# Patient Record
Sex: Female | Born: 1945 | Race: White | Hispanic: No | Marital: Single | Smoking: Never smoker
Health system: Southern US, Community
[De-identification: ages and names within clinical notes are randomized; demographics above are authoritative.]

## PROBLEM LIST (undated history)

## (undated) DIAGNOSIS — G43909 Migraine, unspecified, not intractable, without status migrainosus: Secondary | ICD-10-CM

## (undated) DIAGNOSIS — M810 Age-related osteoporosis without current pathological fracture: Secondary | ICD-10-CM

## (undated) DIAGNOSIS — R87619 Unspecified abnormal cytological findings in specimens from cervix uteri: Secondary | ICD-10-CM

## (undated) DIAGNOSIS — D219 Benign neoplasm of connective and other soft tissue, unspecified: Secondary | ICD-10-CM

## (undated) DIAGNOSIS — I10 Essential (primary) hypertension: Secondary | ICD-10-CM

## (undated) HISTORY — DX: Age-related osteoporosis without current pathological fracture: M81.0

## (undated) HISTORY — PX: BREAST BIOPSY: SHX20

## (undated) HISTORY — DX: Migraine, unspecified, not intractable, without status migrainosus: G43.909

## (undated) HISTORY — PX: HEMORROIDECTOMY: SUR656

## (undated) HISTORY — PX: BREAST EXCISIONAL BIOPSY: SUR124

## (undated) HISTORY — DX: Benign neoplasm of connective and other soft tissue, unspecified: D21.9

## (undated) HISTORY — DX: Essential (primary) hypertension: I10

## (undated) HISTORY — PX: OTHER SURGICAL HISTORY: SHX169

## (undated) HISTORY — PX: HYSTEROSCOPY: SHX211

## (undated) HISTORY — DX: Unspecified abnormal cytological findings in specimens from cervix uteri: R87.619

---

## 1971-09-28 HISTORY — PX: TUBAL LIGATION: SHX77

## 1986-09-27 HISTORY — PX: OTHER SURGICAL HISTORY: SHX169

## 1998-11-05 ENCOUNTER — Ambulatory Visit: Admission: RE | Admit: 1998-11-05 | Discharge: 1998-11-05 | Payer: Self-pay | Admitting: Internal Medicine

## 1998-11-17 ENCOUNTER — Ambulatory Visit: Admission: RE | Admit: 1998-11-17 | Discharge: 1998-11-17 | Payer: Self-pay | Admitting: Internal Medicine

## 1999-07-07 ENCOUNTER — Encounter: Admission: RE | Admit: 1999-07-07 | Discharge: 1999-07-07 | Payer: Self-pay | Admitting: Internal Medicine

## 1999-07-23 ENCOUNTER — Other Ambulatory Visit: Admission: RE | Admit: 1999-07-23 | Discharge: 1999-07-23 | Payer: Self-pay | Admitting: Obstetrics and Gynecology

## 2000-08-01 ENCOUNTER — Other Ambulatory Visit: Admission: RE | Admit: 2000-08-01 | Discharge: 2000-08-01 | Payer: Self-pay | Admitting: Obstetrics and Gynecology

## 2000-08-01 ENCOUNTER — Encounter: Admission: RE | Admit: 2000-08-01 | Discharge: 2000-08-01 | Payer: Self-pay | Admitting: Obstetrics and Gynecology

## 2000-08-01 ENCOUNTER — Encounter: Payer: Self-pay | Admitting: Obstetrics and Gynecology

## 2000-08-04 ENCOUNTER — Encounter: Payer: Self-pay | Admitting: Obstetrics and Gynecology

## 2000-08-04 ENCOUNTER — Encounter: Admission: RE | Admit: 2000-08-04 | Discharge: 2000-08-04 | Payer: Self-pay | Admitting: Obstetrics and Gynecology

## 2001-09-01 ENCOUNTER — Other Ambulatory Visit: Admission: RE | Admit: 2001-09-01 | Discharge: 2001-09-01 | Payer: Self-pay | Admitting: Obstetrics and Gynecology

## 2001-09-04 ENCOUNTER — Encounter: Payer: Self-pay | Admitting: Obstetrics and Gynecology

## 2001-09-04 ENCOUNTER — Ambulatory Visit (HOSPITAL_COMMUNITY): Admission: RE | Admit: 2001-09-04 | Discharge: 2001-09-04 | Payer: Self-pay | Admitting: Obstetrics and Gynecology

## 2001-09-21 ENCOUNTER — Encounter: Payer: Self-pay | Admitting: Obstetrics and Gynecology

## 2001-09-21 ENCOUNTER — Encounter: Admission: RE | Admit: 2001-09-21 | Discharge: 2001-09-21 | Payer: Self-pay | Admitting: Obstetrics and Gynecology

## 2002-08-17 ENCOUNTER — Encounter: Admission: RE | Admit: 2002-08-17 | Discharge: 2002-08-17 | Payer: Self-pay | Admitting: Obstetrics and Gynecology

## 2002-08-17 ENCOUNTER — Encounter: Payer: Self-pay | Admitting: Obstetrics and Gynecology

## 2002-10-12 ENCOUNTER — Other Ambulatory Visit: Admission: RE | Admit: 2002-10-12 | Discharge: 2002-10-12 | Payer: Self-pay | Admitting: Obstetrics and Gynecology

## 2003-08-20 ENCOUNTER — Ambulatory Visit (HOSPITAL_COMMUNITY): Admission: RE | Admit: 2003-08-20 | Discharge: 2003-08-20 | Payer: Self-pay | Admitting: Family Medicine

## 2004-09-07 ENCOUNTER — Ambulatory Visit (HOSPITAL_COMMUNITY): Admission: RE | Admit: 2004-09-07 | Discharge: 2004-09-07 | Payer: Self-pay | Admitting: Obstetrics and Gynecology

## 2004-12-11 ENCOUNTER — Other Ambulatory Visit: Admission: RE | Admit: 2004-12-11 | Discharge: 2004-12-11 | Payer: Self-pay | Admitting: Obstetrics and Gynecology

## 2005-11-05 ENCOUNTER — Ambulatory Visit (HOSPITAL_COMMUNITY): Admission: RE | Admit: 2005-11-05 | Discharge: 2005-11-05 | Payer: Self-pay | Admitting: Obstetrics and Gynecology

## 2006-03-01 ENCOUNTER — Other Ambulatory Visit: Admission: RE | Admit: 2006-03-01 | Discharge: 2006-03-01 | Payer: Self-pay | Admitting: Obstetrics and Gynecology

## 2006-11-17 ENCOUNTER — Encounter: Admission: RE | Admit: 2006-11-17 | Discharge: 2006-11-17 | Payer: Self-pay | Admitting: Obstetrics and Gynecology

## 2007-04-21 ENCOUNTER — Other Ambulatory Visit: Admission: RE | Admit: 2007-04-21 | Discharge: 2007-04-21 | Payer: Self-pay | Admitting: Obstetrics and Gynecology

## 2007-11-24 ENCOUNTER — Encounter: Admission: RE | Admit: 2007-11-24 | Discharge: 2007-11-24 | Payer: Self-pay | Admitting: Obstetrics and Gynecology

## 2008-05-13 ENCOUNTER — Other Ambulatory Visit: Admission: RE | Admit: 2008-05-13 | Discharge: 2008-05-13 | Payer: Self-pay | Admitting: Obstetrics and Gynecology

## 2008-11-29 ENCOUNTER — Encounter: Admission: RE | Admit: 2008-11-29 | Discharge: 2008-11-29 | Payer: Self-pay | Admitting: Obstetrics and Gynecology

## 2009-09-15 ENCOUNTER — Ambulatory Visit (HOSPITAL_COMMUNITY): Admission: RE | Admit: 2009-09-15 | Discharge: 2009-09-15 | Payer: Self-pay | Admitting: Obstetrics and Gynecology

## 2009-12-05 ENCOUNTER — Encounter: Admission: RE | Admit: 2009-12-05 | Discharge: 2009-12-05 | Payer: Self-pay | Admitting: Obstetrics and Gynecology

## 2010-10-17 ENCOUNTER — Other Ambulatory Visit: Payer: Self-pay | Admitting: Obstetrics & Gynecology

## 2010-10-17 DIAGNOSIS — Z1239 Encounter for other screening for malignant neoplasm of breast: Secondary | ICD-10-CM

## 2010-12-07 ENCOUNTER — Ambulatory Visit
Admission: RE | Admit: 2010-12-07 | Discharge: 2010-12-07 | Disposition: A | Discharge status: Home / Self Care | Payer: Medicare Other | Source: Ambulatory Visit | Attending: Obstetrics & Gynecology | Admitting: Obstetrics & Gynecology

## 2010-12-07 DIAGNOSIS — Z1239 Encounter for other screening for malignant neoplasm of breast: Secondary | ICD-10-CM

## 2010-12-28 LAB — BASIC METABOLIC PANEL
BUN: 16 mg/dL (ref 6–23)
CO2: 32 mEq/L (ref 19–32)
Calcium: 9.8 mg/dL (ref 8.4–10.5)
Chloride: 101 mEq/L (ref 96–112)
Creatinine, Ser: 0.41 mg/dL (ref 0.4–1.2)
GFR calc Af Amer: >60 mL/min (ref >60)
GFR calc non Af Amer: >60 mL/min (ref >60)
Glucose, Bld: 97 mg/dL (ref 70–99)
Potassium: 3.6 mEq/L (ref 3.5–5.1)
Sodium: 138 mEq/L (ref 135–145)

## 2010-12-28 LAB — CBC
HCT: 44.8 % (ref 36.0–46.0)
Hemoglobin: 15.1 g/dL — ABNORMAL HIGH (ref 12.0–15.0)
MCHC: 33.6 g/dL (ref 30.0–36.0)
MCV: 94.5 fL (ref 78.0–100.0)
Platelets: 241 10*3/uL (ref 150–400)
RBC: 4.74 MIL/uL (ref 3.87–5.11)
RDW: 13 % (ref 11.5–15.5)
WBC: 7.7 10*3/uL (ref 4.0–10.5)

## 2011-07-05 ENCOUNTER — Other Ambulatory Visit: Payer: Self-pay | Admitting: Obstetrics & Gynecology

## 2011-07-05 DIAGNOSIS — Z1231 Encounter for screening mammogram for malignant neoplasm of breast: Secondary | ICD-10-CM

## 2011-12-09 ENCOUNTER — Ambulatory Visit
Admission: RE | Admit: 2011-12-09 | Discharge: 2011-12-09 | Disposition: A | Discharge status: Home / Self Care | Payer: Medicare Other | Source: Ambulatory Visit | Attending: Obstetrics & Gynecology | Admitting: Obstetrics & Gynecology

## 2011-12-09 DIAGNOSIS — Z1231 Encounter for screening mammogram for malignant neoplasm of breast: Secondary | ICD-10-CM

## 2012-11-13 ENCOUNTER — Other Ambulatory Visit: Payer: Self-pay | Admitting: Obstetrics & Gynecology

## 2012-11-13 DIAGNOSIS — Z1231 Encounter for screening mammogram for malignant neoplasm of breast: Secondary | ICD-10-CM

## 2012-12-11 ENCOUNTER — Ambulatory Visit
Admission: RE | Admit: 2012-12-11 | Discharge: 2012-12-11 | Disposition: A | Discharge status: Home / Self Care | Payer: Medicare Other | Source: Ambulatory Visit | Attending: Obstetrics & Gynecology | Admitting: Obstetrics & Gynecology

## 2012-12-11 DIAGNOSIS — Z1231 Encounter for screening mammogram for malignant neoplasm of breast: Secondary | ICD-10-CM

## 2013-06-21 ENCOUNTER — Encounter: Payer: Self-pay | Admitting: Obstetrics & Gynecology

## 2013-10-01 ENCOUNTER — Other Ambulatory Visit: Payer: Self-pay | Admitting: Obstetrics and Gynecology

## 2013-10-01 ENCOUNTER — Other Ambulatory Visit: Payer: Self-pay

## 2013-10-01 ENCOUNTER — Telehealth: Payer: Self-pay | Admitting: Obstetrics & Gynecology

## 2013-10-01 DIAGNOSIS — M810 Age-related osteoporosis without current pathological fracture: Secondary | ICD-10-CM

## 2013-10-01 DIAGNOSIS — Z1231 Encounter for screening mammogram for malignant neoplasm of breast: Secondary | ICD-10-CM

## 2013-10-01 NOTE — Telephone Encounter (Signed)
Order placed for bone density by Dr. Quincy Simmonds for Dr. Sabra Heck.

## 2013-10-01 NOTE — Telephone Encounter (Signed)
Pt needs an order for her bone density scan faxed to the breast center. Appointment is scheduled for 3/19

## 2013-10-01 NOTE — Telephone Encounter (Signed)
OK for BMD in March? Last one 12-09-11.

## 2013-10-09 NOTE — Telephone Encounter (Signed)
Spoke with [facility]. Dexa added to appointment at beginning for 1030. Patient aware.

## 2013-10-10 ENCOUNTER — Ambulatory Visit: Payer: Self-pay | Admitting: Obstetrics & Gynecology

## 2013-10-11 ENCOUNTER — Encounter: Payer: Self-pay | Admitting: Obstetrics & Gynecology

## 2013-10-12 ENCOUNTER — Ambulatory Visit (INDEPENDENT_AMBULATORY_CARE_PROVIDER_SITE_OTHER): Payer: Medicare Other | Admitting: Obstetrics & Gynecology

## 2013-10-12 ENCOUNTER — Encounter: Payer: Self-pay | Admitting: Obstetrics & Gynecology

## 2013-10-12 VITALS — BP 130/64 | HR 72 | Resp 16 | Ht 63.75 in | Wt 160.2 lb

## 2013-10-12 DIAGNOSIS — Z01419 Encounter for gynecological examination (general) (routine) without abnormal findings: Secondary | ICD-10-CM

## 2013-10-12 NOTE — Progress Notes (Signed)
68 y.o. G2P2 SingleCaucasianF here for annual exam.  Doing well.  Had a good year and good holiday.  No vaginal bleeding.  Patient's last menstrual period was 08/27/2009.          Sexually active: no  The current method of family planning is BTL    Exercising: yes  walking Smoker:  no  Health Maintenance: Pap:  07/18/12 WNL History of abnormal Pap:  Yes years ago MMG:  12/11/12 normal Colonoscopy:  4/06 repeat in 10 years BMD:   12/09/11 osteoporosis, scheduled for 12/13/13 TDaP:  2014 Screening Labs: PCP, Hb today: PCP, Urine today: PCP   reports that she has never smoked. She has never used smokeless tobacco. She reports that she drinks alcohol. She reports that she does not use illicit drugs.  Past Medical History  Diagnosis Date  . Migraine   . Fibroids   . Hypertension   . Osteoporosis   . Abnormal Pap smear of cervix     Past Surgical History  Procedure Laterality Date  . Hysteroscopy      resection, myoma, large polyps  . Tubal ligation    . Hemorroidectomy    . Breast biopsy      left-simple ductal hyperplasia  . Lung wedge resection      right  . Ckc  1988    CIN I, ? CIS, BX-CIN 3    Current Outpatient Prescriptions  Medication Sig Dispense Refill  . aspirin 81 MG tablet Take 81 mg by mouth daily.      . cholecalciferol (VITAMIN D) 1000 UNITS tablet Take 1,000 Units by mouth daily.      . hydrochlorothiazide (MICROZIDE) 12.5 MG capsule Take 12.5 mg by mouth daily.      . Multiple Vitamins-Minerals (CENTRUM SILVER ADULT 50+ PO) Take by mouth daily.       No current facility-administered medications for this visit.    Family History  Problem Relation Age of Onset  . Breast cancer Mother 51  . Breast cancer Maternal Aunt   . Hypertension Mother   . Heart attack Father   . Colon cancer Sister     deceased    ROS:  Pertinent items are noted in HPI.  Otherwise, a comprehensive ROS was negative.  Exam:   BP 130/64  Pulse 72  Resp 16  Ht 5' 3.75"  (1.619 m)  Wt 160 lb 3.2 oz (72.666 kg)  BMI 27.72 kg/m2  LMP 08/27/2009  Weight change: no change   Height: 5' 3.75" (161.9 cm)  Ht Readings from Last 3 Encounters:  10/12/13 5' 3.75" (1.619 m)    General appearance: alert, cooperative and appears stated age Head: Normocephalic, without obvious abnormality, atraumatic Neck: no adenopathy, supple, symmetrical, trachea midline and thyroid normal to inspection and palpation Lungs: clear to auscultation bilaterally Breasts: normal appearance, no masses or tenderness Heart: regular rate and rhythm Abdomen: soft, non-tender; bowel sounds normal; no masses,  no organomegaly Extremities: extremities normal, atraumatic, no cyanosis or edema Skin: Skin color, texture, turgor normal. No rashes or lesions Lymph nodes: Cervical, supraclavicular, and axillary nodes normal. No abnormal inguinal nodes palpated Neurologic: Grossly normal   Pelvic: External genitalia:  no lesions              Urethra:  normal appearing urethra with no masses, tenderness or lesions              Bartholins and Skenes: normal  Vagina: normal appearing vagina with normal color and discharge, no lesions, 3rd degree cystocele              Cervix: no lesions              Pap taken: no Bimanual Exam:  Uterus:  normal size, contour, position, consistency, mobility, non-tender              Adnexa: normal adnexa and no mass, fullness, tenderness               Rectovaginal: Confirms               Anus:  normal sphincter tone, no lesions  A:  Well Woman with normal exam PMP, no HRT H/o fibroid uterus H/O CIN 3 s/p LEEP 1998, doing pap smears every other year Osteoporosis 3rd degree cystocele  P:   Mammogram yearly. Saw Dr. Allyson Sabal end of last year for skin survey pap smear every other year. Labs with Dr. Osborne Casco. Will add Vertebral fracture assessment to BMD scheduled already. D/W pt cystocele and treatment options.  She does not feel symptoms are  significant enough at this time for this.  Will continue to monitor. return annually or prn  An After Visit Summary was printed and given to the patient.

## 2013-10-12 NOTE — Patient Instructions (Signed)

## 2013-12-13 ENCOUNTER — Ambulatory Visit
Admission: RE | Admit: 2013-12-13 | Discharge: 2013-12-13 | Disposition: A | Discharge status: Home / Self Care | Payer: Medicare Other | Source: Ambulatory Visit

## 2013-12-13 ENCOUNTER — Ambulatory Visit
Admission: RE | Admit: 2013-12-13 | Discharge: 2013-12-13 | Disposition: A | Discharge status: Home / Self Care | Payer: Medicare Other | Source: Ambulatory Visit | Attending: Obstetrics and Gynecology | Admitting: Obstetrics and Gynecology

## 2013-12-13 ENCOUNTER — Ambulatory Visit: Payer: Medicare Other

## 2013-12-13 DIAGNOSIS — M810 Age-related osteoporosis without current pathological fracture: Secondary | ICD-10-CM

## 2013-12-13 DIAGNOSIS — Z1231 Encounter for screening mammogram for malignant neoplasm of breast: Secondary | ICD-10-CM

## 2013-12-25 ENCOUNTER — Encounter: Payer: Self-pay | Admitting: Obstetrics & Gynecology

## 2013-12-26 ENCOUNTER — Institutional Professional Consult (permissible substitution): Payer: Medicare Other | Admitting: Obstetrics & Gynecology

## 2013-12-28 ENCOUNTER — Encounter: Payer: Self-pay | Admitting: Obstetrics & Gynecology

## 2014-01-07 ENCOUNTER — Institutional Professional Consult (permissible substitution): Payer: Medicare Other | Admitting: Obstetrics & Gynecology

## 2014-01-14 ENCOUNTER — Institutional Professional Consult (permissible substitution): Payer: Medicare Other | Admitting: Obstetrics & Gynecology

## 2014-07-29 ENCOUNTER — Encounter: Payer: Self-pay | Admitting: Obstetrics & Gynecology

## 2014-09-26 ENCOUNTER — Other Ambulatory Visit: Payer: Self-pay

## 2014-09-26 DIAGNOSIS — Z1231 Encounter for screening mammogram for malignant neoplasm of breast: Secondary | ICD-10-CM

## 2014-10-22 ENCOUNTER — Encounter: Payer: Self-pay | Admitting: Obstetrics & Gynecology

## 2014-10-22 ENCOUNTER — Ambulatory Visit (INDEPENDENT_AMBULATORY_CARE_PROVIDER_SITE_OTHER): Payer: Medicare Other | Admitting: Obstetrics & Gynecology

## 2014-10-22 VITALS — BP 134/80 | HR 80 | Resp 16 | Ht 63.75 in | Wt 159.0 lb

## 2014-10-22 DIAGNOSIS — Z124 Encounter for screening for malignant neoplasm of cervix: Secondary | ICD-10-CM

## 2014-10-22 DIAGNOSIS — Z01419 Encounter for gynecological examination (general) (routine) without abnormal findings: Secondary | ICD-10-CM

## 2014-10-22 NOTE — Progress Notes (Signed)
69 y.o. G2P2 SingleCaucasianF here for annual exam.  Doing well.  No vaginal bleeding.  Pt declines having another colonoscopy.    Pt with known cystocele.  Pt reports she is not feeling any new symptoms.  Not interested in any surgical repair at this time.  occasional bladder leaking but this is mild and unchanged.    Patient's last menstrual period was 08/27/2009.          Sexually active: No.  The current method of family planning is post menopausal status.    Exercising: Yes.    Walking Smoker:  no  Health Maintenance: Pap: 06/2012 Neg History of abnormal Pap:  yes MMG:  11/2013 BIRADS1:neg Colonoscopy:  12/2004 every 10 years.  Declines!!  Does stool tests for blood with Dr. Osborne Casco. BMD:  11/2013, -2.7, -1.2 TDaP:  2014 Screening Labs: PCP, Hb today: PCP, Urine today: PCP   reports that she has never smoked. She has never used smokeless tobacco. She reports that she drinks about 4.2 oz of alcohol per week. She reports that she does not use illicit drugs.  Past Medical History  Diagnosis Date  . Migraine   . Fibroids   . Hypertension   . Osteoporosis   . Abnormal Pap smear of cervix     Past Surgical History  Procedure Laterality Date  . Hysteroscopy      resection, myoma, large polyps  . Tubal ligation    . Hemorroidectomy    . Breast biopsy      left-simple ductal hyperplasia  . Lung wedge resection      right  . Ckc  1988    CIN I, ? CIS, BX-CIN 3    Current Outpatient Prescriptions  Medication Sig Dispense Refill  . aspirin 81 MG tablet Take 81 mg by mouth daily.    . cholecalciferol (VITAMIN D) 1000 UNITS tablet Take 1,000 Units by mouth daily.    . hydrochlorothiazide (MICROZIDE) 12.5 MG capsule Take 12.5 mg by mouth daily.    . Multiple Vitamins-Minerals (CENTRUM SILVER ADULT 50+ PO) Take by mouth daily.     No current facility-administered medications for this visit.    Family History  Problem Relation Age of Onset  . Breast cancer Mother 21  .  Breast cancer Maternal Aunt   . Hypertension Mother   . Heart attack Father   . Colon cancer Sister     deceased    ROS:  Pertinent items are noted in HPI.  Otherwise, a comprehensive ROS was negative.  Exam:   General appearance: alert, cooperative and appears stated age Head: Normocephalic, without obvious abnormality, atraumatic Neck: no adenopathy, supple, symmetrical, trachea midline and thyroid normal to inspection and palpation Lungs: clear to auscultation bilaterally Breasts: normal appearance, no masses or tenderness Heart: regular rate and rhythm Abdomen: soft, non-tender; bowel sounds normal; no masses,  no organomegaly Extremities: extremities normal, atraumatic, no cyanosis or edema Skin: Skin color, texture, turgor normal. No rashes or lesions Lymph nodes: Cervical, supraclavicular, and axillary nodes normal. No abnormal inguinal nodes palpated Neurologic: Grossly normal   Pelvic: External genitalia:  no lesions              Urethra:  normal appearing urethra with no masses, tenderness or lesions              Bartholins and Skenes: normal                 Vagina: normal appearing vagina with normal color and discharge,  no lesions              Cervix: no lesions              Pap taken: Yes.   Bimanual Exam:  Uterus:  normal size, contour, position, consistency, mobility, non-tender              Adnexa: normal adnexa and no mass, fullness, tenderness               Rectovaginal: Confirms               Anus:  normal sphincter tone, no lesions  Chaperone was present for exam.  A:  Well Woman with normal exam PMP, no HRT H/o fibroid uterus H/O CIN 3 s/p LEEP 1998, doing pap smears every other year Osteoporosis 3rd degree cystocele  P: Mammogram yearly. pap smear today Labs/vaccines with Dr. Osborne Casco Pt aware of cystocele and continues to desire conservative management. return annually or prn

## 2014-10-24 LAB — IPS PAP SMEAR ONLY

## 2014-12-19 ENCOUNTER — Ambulatory Visit
Admission: RE | Admit: 2014-12-19 | Discharge: 2014-12-19 | Disposition: A | Discharge status: Home / Self Care | Payer: Medicare Other | Source: Ambulatory Visit

## 2014-12-19 DIAGNOSIS — Z1231 Encounter for screening mammogram for malignant neoplasm of breast: Secondary | ICD-10-CM

## 2015-03-24 ENCOUNTER — Encounter: Payer: Self-pay | Admitting: Obstetrics & Gynecology

## 2015-03-24 ENCOUNTER — Other Ambulatory Visit: Payer: Self-pay

## 2015-03-24 NOTE — Telephone Encounter (Signed)
Immunization updated.  Routing to provider for final review.   Will close encounter.

## 2015-11-11 ENCOUNTER — Other Ambulatory Visit: Payer: Self-pay

## 2015-11-11 DIAGNOSIS — Z1231 Encounter for screening mammogram for malignant neoplasm of breast: Secondary | ICD-10-CM

## 2015-12-22 ENCOUNTER — Ambulatory Visit
Admission: RE | Admit: 2015-12-22 | Discharge: 2015-12-22 | Disposition: A | Discharge status: Home / Self Care | Payer: Medicare Other | Source: Ambulatory Visit

## 2015-12-22 DIAGNOSIS — Z1231 Encounter for screening mammogram for malignant neoplasm of breast: Secondary | ICD-10-CM

## 2015-12-23 ENCOUNTER — Encounter: Payer: Self-pay | Admitting: Obstetrics & Gynecology

## 2015-12-23 ENCOUNTER — Ambulatory Visit (INDEPENDENT_AMBULATORY_CARE_PROVIDER_SITE_OTHER): Payer: Medicare Other | Admitting: Obstetrics & Gynecology

## 2015-12-23 VITALS — BP 136/78 | HR 86 | Resp 16 | Ht 63.5 in | Wt 162.0 lb

## 2015-12-23 DIAGNOSIS — I10 Essential (primary) hypertension: Secondary | ICD-10-CM

## 2015-12-23 DIAGNOSIS — D069 Carcinoma in situ of cervix, unspecified: Secondary | ICD-10-CM

## 2015-12-23 DIAGNOSIS — Z01419 Encounter for gynecological examination (general) (routine) without abnormal findings: Secondary | ICD-10-CM

## 2015-12-23 DIAGNOSIS — Z124 Encounter for screening for malignant neoplasm of cervix: Secondary | ICD-10-CM | POA: Diagnosis not present

## 2015-12-23 NOTE — Progress Notes (Addendum)
70 y.o. G2P2 SingleCaucasianF here for annual exam.  Doing well.  No vaginal bleeding.    PCP:  Dr. Osborne Casco.  Saw in October.  Brought lab work with her.  LMP:  ~1998          Sexually active: No.  The current method of family planning is post menopausal status.    Exercising: Yes.    Walking Smoker:  no  Health Maintenance: Pap:  10/22/14 Neg History of abnormal Pap:  yes MMG:  12/22/15 BIRADS1:neg Colonoscopy:  12/2004 Normal. Pt declines f/u.  Did test for blood in stool with Dr. Osborne Casco. BMD:   12/14/13 Osteoporosis  TDaP:  2014  Screening Labs: PCP, Urine today: PCP   reports that she has never smoked. She has never used smokeless tobacco. She reports that she drinks about 4.2 oz of alcohol per week. She reports that she does not use illicit drugs.  Past Medical History  Diagnosis Date  . Migraine   . Fibroids   . Hypertension   . Osteoporosis   . Abnormal Pap smear of cervix     Past Surgical History  Procedure Laterality Date  . Hysteroscopy      resection, myoma, large polyps  . Tubal ligation    . Hemorroidectomy    . Breast biopsy      left-simple ductal hyperplasia  . Lung wedge resection      right  . Ckc  1988    CIN I, ? CIS, BX-CIN 3    Current Outpatient Prescriptions  Medication Sig Dispense Refill  . aspirin 81 MG tablet Take 81 mg by mouth daily.    . hydrochlorothiazide (HYDRODIURIL) 12.5 MG tablet TK 1 T PO QD  4  . Multiple Vitamins-Minerals (CENTRUM SILVER ADULT 50+ PO) Take by mouth daily.     No current facility-administered medications for this visit.    Family History  Problem Relation Age of Onset  . Breast cancer Mother 36  . Breast cancer Maternal Aunt   . Hypertension Mother   . Heart attack Father   . Colon cancer Sister     deceased    ROS:  Pertinent items are noted in HPI.  Otherwise, a comprehensive ROS was negative.  Exam:   BP 136/78 mmHg  Pulse 86  Resp 16  Ht 5' 3.5" (1.613 m)  Wt 162 lb (73.483 kg)  BMI 28.24  kg/m2  LMP 08/27/2009  Weight change: +3#   Height: 5' 3.5" (161.3 cm)  Ht Readings from Last 3 Encounters:  12/23/15 5' 3.5" (1.613 m)  10/22/14 5' 3.75" (1.619 m)  10/12/13 5' 3.75" (1.619 m)    General appearance: alert, cooperative and appears stated age Head: Normocephalic, without obvious abnormality, atraumatic Neck: no adenopathy, supple, symmetrical, trachea midline and thyroid normal to inspection and palpation Lungs: clear to auscultation bilaterally Breasts: normal appearance, no masses or tenderness Heart: regular rate and rhythm Abdomen: soft, non-tender; bowel sounds normal; no masses,  no organomegaly Extremities: extremities normal, atraumatic, no cyanosis or edema Skin: Skin color, texture, turgor normal. No rashes or lesions Lymph nodes: Cervical, supraclavicular, and axillary nodes normal. No abnormal inguinal nodes palpated Neurologic: Grossly normal   Pelvic: External genitalia:  no lesions              Urethra:  normal appearing urethra with no masses, tenderness or lesions              Bartholins and Skenes: normal  Vagina: normal appearing vagina with normal color and discharge, no lesions              Cervix: no lesions              Pap taken: No. Bimanual Exam:  Uterus:  normal size, contour, position, consistency, mobility, non-tender              Adnexa: normal adnexa and no mass, fullness, tenderness               Rectovaginal: Confirms               Anus:  normal sphincter tone, no lesions  Chaperone was present for exam.  A:  Well Woman with normal exam PMP, no HRT H/o fibroid uterus H/O CIN 3 s/p LEEP 1998 Osteoporosis 3rd degree cystocele.  Declines treatment.  P: Mammogram yearly pap smear today Labs/vaccines with Dr. Osborne Casco Declines colonoscopy.  Pt aware I disagree with this. Declines repeat BMD, for now. return annually or prn

## 2015-12-24 ENCOUNTER — Telehealth: Payer: Self-pay

## 2015-12-24 ENCOUNTER — Encounter: Payer: Self-pay | Admitting: Obstetrics & Gynecology

## 2015-12-24 LAB — IPS PAP SMEAR ONLY

## 2015-12-24 NOTE — Telephone Encounter (Signed)
Changed on note from yesterday and in chart.  Thanks.  Ok to close encounter.

## 2015-12-24 NOTE — Telephone Encounter (Signed)
Telephone encounter created to discuss mychart message with Dr.Miller. 

## 2015-12-24 NOTE — Addendum Note (Signed)
Addended by: Megan Salon on: 12/24/2015 10:02 AM   Modules accepted: Miquel Dunn

## 2015-12-24 NOTE — Telephone Encounter (Signed)
Non-Urgent Medical Question  Message [id number]   From  [place], MD   Sent  12/24/2015 8:13 AM     I think the date of my LMP should be around 1990 (at around age 72-50) instead of 2010....if someone could correct that on my records......[name] KitchenSorry I didn't catch that yesterday when I was there.....  Thank you,  [place]      Responsible Party    Pool - [facility] Pool No one has taken responsibility for this message.     No actions have been taken on this message.     Routing to [place] for review.

## 2016-03-08 ENCOUNTER — Emergency Department (HOSPITAL_COMMUNITY)
Admission: EM | Admit: 2016-03-08 | Discharge: 2016-03-08 | Disposition: A | Discharge status: Home / Self Care | Payer: Medicare Other | Attending: Emergency Medicine | Admitting: Emergency Medicine

## 2016-03-08 ENCOUNTER — Encounter (HOSPITAL_COMMUNITY): Payer: Self-pay | Admitting: *Deleted

## 2016-03-08 ENCOUNTER — Emergency Department (HOSPITAL_COMMUNITY): Payer: Medicare Other

## 2016-03-08 DIAGNOSIS — Z791 Long term (current) use of non-steroidal anti-inflammatories (NSAID): Secondary | ICD-10-CM | POA: Diagnosis not present

## 2016-03-08 DIAGNOSIS — I10 Essential (primary) hypertension: Secondary | ICD-10-CM | POA: Diagnosis not present

## 2016-03-08 DIAGNOSIS — M810 Age-related osteoporosis without current pathological fracture: Secondary | ICD-10-CM | POA: Diagnosis not present

## 2016-03-08 DIAGNOSIS — H6121 Impacted cerumen, right ear: Secondary | ICD-10-CM

## 2016-03-08 DIAGNOSIS — R51 Headache: Secondary | ICD-10-CM | POA: Insufficient documentation

## 2016-03-08 DIAGNOSIS — Z79899 Other long term (current) drug therapy: Secondary | ICD-10-CM | POA: Diagnosis not present

## 2016-03-08 DIAGNOSIS — Z7982 Long term (current) use of aspirin: Secondary | ICD-10-CM | POA: Diagnosis not present

## 2016-03-08 DIAGNOSIS — R519 Headache, unspecified: Secondary | ICD-10-CM

## 2016-03-08 MED ORDER — CARBAMIDE PEROXIDE 6.5 % OT SOLN: 5.0000 [drp] | Freq: Two times a day (BID) | OTIC | Status: DC

## 2016-03-08 NOTE — Discharge Instructions (Signed)
Cerumen Impaction The structures of the external ear canal secrete a waxy substance known as cerumen. Excess cerumen can build up in the ear canal, causing a condition known as cerumen impaction. Cerumen impaction can cause ear pain and disrupt the function of the ear. The rate of cerumen production differs for each individual. In certain individuals, the configuration of the ear canal may decrease his or her ability to naturally remove cerumen. CAUSES Cerumen impaction is caused by excessive cerumen production or buildup. RISK FACTORS  Frequent use of swabs to clean ears.  Having narrow ear canals.  Having eczema.  Being dehydrated. SIGNS AND SYMPTOMS  Diminished hearing.  Ear drainage.  Ear pain.  Ear itch. TREATMENT Treatment may involve:  Over-the-counter or prescription ear drops to soften the cerumen.  Removal of cerumen by a health care provider. This may be done with:  Irrigation with warm water. This is the most common method of removal.  Ear curettes and other instruments.  Surgery. This may be done in severe cases. HOME CARE INSTRUCTIONS  Take medicines only as directed by your health care provider.  Do not insert objects into the ear with the intent of cleaning the ear. PREVENTION  Do not insert objects into the ear, even with the intent of cleaning the ear. Removing cerumen as a part of normal hygiene is not necessary, and the use of swabs in the ear canal is not recommended.  Drink enough water to keep your urine clear or pale yellow.  Control your eczema if you have it. SEEK MEDICAL CARE IF:  You develop ear pain.  You develop bleeding from the ear.  The cerumen does not clear after you use ear drops as directed.   This information is not intended to replace advice given to you by your health care provider. Make sure you discuss any questions you have with your health care provider.   Document Released: 10/21/2004 Document Revised: 10/04/2014  Document Reviewed: 04/30/2015 Elsevier Interactive Patient Education 2016 [place] Headache Without Cause A headache is pain or discomfort felt around the head or neck area. The specific cause of a headache may not be found. There are many causes and types of headaches. A few common ones are:  Tension headaches.  Migraine headaches.  Cluster headaches.  Chronic daily headaches. HOME CARE INSTRUCTIONS  Watch your condition for any changes. Take these steps to help with your condition: Managing Pain  Take over-the-counter and prescription medicines only as told by your health care provider.  Lie down in a dark, quiet room when you have a headache.  If directed, apply ice to the head and neck area:  Put ice in a plastic bag.  Place a towel between your skin and the bag.  Leave the ice on for 20 minutes, 2-3 times per day.  Use a heating pad or hot shower to apply heat to the head and neck area as told by your health care provider.  Keep lights dim if bright lights bother you or make your headaches worse. Eating and Drinking  Eat meals on a regular schedule.  Limit alcohol use.  Decrease the amount of caffeine you drink, or stop drinking caffeine. General Instructions  Keep all follow-up visits as told by your health care provider. This is important.  Keep a headache journal to help find out what may trigger your headaches. For example, write down:  What you eat and drink.  How much sleep you get.  Any change to  your diet or medicines.  Try massage or other relaxation techniques.  Limit stress.  Sit up straight, and do not tense your muscles.  Do not use tobacco products, including cigarettes, chewing tobacco, or e-cigarettes. If you need help quitting, ask your health care provider.  Exercise regularly as told by your health care provider.  Sleep on a regular schedule. Get 7-9 hours of sleep, or the amount recommended by your health care  provider. SEEK MEDICAL CARE IF:   Your symptoms are not helped by medicine.  You have a headache that is different from the usual headache.  You have nausea or you vomit.  You have a fever. SEEK IMMEDIATE MEDICAL CARE IF:   Your headache becomes severe.  You have repeated vomiting.  You have a stiff neck.  You have a loss of vision.  You have problems with speech.  You have pain in the eye or ear.  You have muscular weakness or loss of muscle control.  You lose your balance or have trouble walking.  You feel faint or pass out.  You have confusion.   This information is not intended to replace advice given to you by your health care provider. Make sure you discuss any questions you have with your health care provider.   Document Released: 09/13/2005 Document Revised: 06/04/2015 Document Reviewed: 01/06/2015 Elsevier Interactive Patient Education [employer].

## 2016-03-08 NOTE — ED Provider Notes (Signed)
CSN: [id number]     Arrival date & time 03/08/16  [id number] History   First MD Initiated Contact with Patient 03/08/16 [phone]     Chief Complaint  Patient presents with  . Headache     (Consider location/radiation/quality/duration/timing/severity/associated sxs/prior Treatment) HPI   70 year old female presenting for evaluation of headaches. Patient reports mechanical fall on May 15. She was stooping down on her patio when she lost her balance and fell backwards. She struck the back of her head. She did not lose consciousness. Since that time she has had intermittent headaches. She describes as feeling like a toothache. Seems to be worse at night when trying to sleep and she noticed that laying on her right side seems to exacerbate it. Intermittent ringing in her right ear. She has also intermittently felt off balance. Distance. Worse with changes of position. Denies any neck pain. No acute visual complaints. No nausea or vomiting. No acute numbness, tingling or focal loss of strength. Takes aspirin daily.  Past Medical History  Diagnosis Date  . Migraine   . Fibroids   . Hypertension   . Osteoporosis   . Abnormal Pap smear of cervix    Past Surgical History  Procedure Laterality Date  . Hysteroscopy      resection, myoma, large polyps  . Tubal ligation    . Hemorroidectomy    . Breast biopsy      left-simple ductal hyperplasia  . Lung wedge resection      right  . Ckc  1988    CIN I, ? CIS, BX-CIN 3   Family History  Problem Relation Age of Onset  . Breast cancer Mother 78  . Breast cancer Maternal Aunt   . Hypertension Mother   . Heart attack Father   . Colon cancer Sister     deceased   Social History  Substance Use Topics  . Smoking status: Never Smoker   . Smokeless tobacco: Never Used  . Alcohol Use: 4.2 oz/week    7 Glasses of wine per week     Comment: red wine once daily   OB History    Gravida Para Term Preterm AB TAB SAB Ectopic Multiple Living   2 2        2       Review of Systems  All systems reviewed and negative, other than as noted in HPI.   Allergies  Review of patient's allergies indicates no known allergies.  Home Medications   Prior to Admission medications   Medication Sig Start Date End Date Taking? Authorizing Provider  aspirin 81 MG tablet Take 81 mg by mouth daily.    Historical Provider, MD  hydrochlorothiazide (HYDRODIURIL) 12.5 MG tablet TK 1 T PO QD 12/16/15   Historical Provider, MD  Multiple Vitamins-Minerals (CENTRUM SILVER ADULT 50+ PO) Take by mouth daily.    Historical Provider, MD   BP 171/82 mmHg  Pulse 81  Temp(Src) 98.7 F (37.1 C) (Oral)  Resp 16  Ht 5' 4.5" (1.638 m)  Wt 162 lb (73.483 kg)  BMI 27.39 kg/m2  SpO2 100%  LMP 09/27/1996 Physical Exam  Constitutional: She is oriented to person, place, and time. She appears well-developed and well-nourished. No distress.  HENT:  Head: Normocephalic and atraumatic.  Cerumen impaction on the right. Left external auditory canal and tympanic membrane grossly normal in appearance. No scalp tenderness. No concerning skin lesions noted. No neck tenderness. Denies any pain with range of motion of her neck.  Eyes: Conjunctivae and  EOM are normal. Pupils are equal, round, and reactive to light. Right eye exhibits no discharge. Left eye exhibits no discharge.  Neck: Neck supple.  Cardiovascular: Normal rate, regular rhythm and normal heart sounds.  Exam reveals no gallop and no friction rub.   No murmur heard. Pulmonary/Chest: Effort normal and breath sounds normal. No respiratory distress.  Abdominal: Soft. She exhibits no distension. There is no tenderness.  Musculoskeletal: She exhibits no edema or tenderness.  Neurological: She is alert and oriented to person, place, and time. No cranial nerve deficit. She exhibits normal muscle tone. Coordination normal.  Speech clear. Content appropriate. Follows commands. Cranial nerves II through XII are intact. Good finger to  nose bilaterally. Gait is steady.  Skin: Skin is warm and dry.  Psychiatric: She has a normal mood and affect. Her behavior is normal. Thought content normal.  Nursing note and vitals reviewed.   ED Course  Procedures (including critical care time) Labs Review Labs Reviewed - No data to display  Imaging Review No results found.   Ct Head Wo Contrast  03/08/2016  CLINICAL DATA:  Pain following fall 3 weeks prior with headache and gait disturbance EXAM: CT HEAD WITHOUT CONTRAST TECHNIQUE: Contiguous axial images were obtained from the base of the skull through the vertex without intravenous contrast. COMPARISON:  None. FINDINGS: The ventricles are normal in size and configuration. There is no intracranial mass, hemorrhage, extra-axial fluid collection, or midline shift. Gray-white compartments appear normal. No acute infarct evident. Bony calvarium appears intact. Mastoid air cells are clear. Orbits appear symmetric bilaterally. IMPRESSION: Study within normal limits. Electronically Signed   By: Lowella Grip III M.D.   On: 03/08/2016 10:40   I have personally reviewed and evaluated these images and lab results as part of my medical decision-making.   EKG Interpretation None      MDM   Final diagnoses:  Nonintractable headache, unspecified chronicity pattern, unspecified headache type  Cerumen impaction, right    70 year old female with intermittent headaches, right ear ringing and feeling off balance for the past several weeks after striking her head. Not sure for symptoms are necessarily related to this fall. She does have a cerumen impaction on the right. This may be contributing to her ear symptoms in the way she describes feeling off balance this may potentially be vertigo. Certainly could be result of a head injury but it very well could be an issue as well. Her neuro exam is nonfocal. Really don't appreciate any scalp or neck tenderness. I do not feel that head imaging is  unreasonable though.    Virgel Manifold, MD 03/19/16 2131

## 2016-03-08 NOTE — ED Notes (Signed)
Pt reports fall May 15th, fell backwards, hitting the back of her head on concrete.  States R side h/a and R ear pain with unsteady gait 2 weeks after the fall.  Never went to see her doctor for it.  Pt denies blood thinners but reports taking ASA daily.  Pt is A&O x 4.  Pt reports she called her PCP's office today to see her doctor but he was not in and she was advised to the come to the ED for a head CT.

## 2016-11-09 ENCOUNTER — Other Ambulatory Visit: Payer: Self-pay | Admitting: Internal Medicine

## 2016-11-09 DIAGNOSIS — Z1231 Encounter for screening mammogram for malignant neoplasm of breast: Secondary | ICD-10-CM

## 2016-12-22 ENCOUNTER — Ambulatory Visit
Admission: RE | Admit: 2016-12-22 | Discharge: 2016-12-22 | Disposition: A | Discharge status: Home / Self Care | Payer: Medicare Other | Source: Ambulatory Visit | Attending: Internal Medicine | Admitting: Internal Medicine

## 2016-12-22 DIAGNOSIS — Z1231 Encounter for screening mammogram for malignant neoplasm of breast: Secondary | ICD-10-CM

## 2016-12-28 ENCOUNTER — Encounter: Payer: Self-pay | Admitting: Obstetrics & Gynecology

## 2016-12-28 ENCOUNTER — Ambulatory Visit (INDEPENDENT_AMBULATORY_CARE_PROVIDER_SITE_OTHER): Payer: Medicare Other | Admitting: Obstetrics & Gynecology

## 2016-12-28 VITALS — BP 130/80 | HR 80 | Resp 16 | Ht 63.25 in | Wt 164.0 lb

## 2016-12-28 DIAGNOSIS — Z124 Encounter for screening for malignant neoplasm of cervix: Secondary | ICD-10-CM

## 2016-12-28 DIAGNOSIS — Z01419 Encounter for gynecological examination (general) (routine) without abnormal findings: Secondary | ICD-10-CM | POA: Diagnosis not present

## 2016-12-28 NOTE — Progress Notes (Signed)
71 y.o. G2P2 Single Caucasian F here for annual exam.  Doing well.  Had a fall last De[place].  Did have an evaluation at the ER.  Had a CT scan in the ER.    Denies vaginal bleeding.    Patient's last menstrual period was 08/27/2009.          Sexually active: No.  The current method of family planning is post menopausal status.    Exercising: Yes.    Walk Smoker:  no  Health Maintenance: Pap:  12/23/15 WNL; 10/22/14 WNL History of abnormal Pap:  yes MMG:  12/22/16 BIRADS1, Density C, TBC Colonoscopy:  12/2004 Normal. Pt declines f/u.  Did test for blood in stool with Dr. Osborne Casco BMD:   12/13/13 Osteoporosis TDaP:  05/28/13  Pneumonia vaccine(s): 07/19/2014 Zostavax:  2014 Hep C testing: PCP Screening Labs: PCP, Hb today: PCP   reports that she has never smoked. She has never used smokeless tobacco. She reports that she drinks about 4.2 oz of alcohol per week . She reports that she does not use drugs.  Past Medical History:  Diagnosis Date  . Abnormal Pap smear of cervix   . Fibroids   . Hypertension   . Migraine   . Osteoporosis     Past Surgical History:  Procedure Laterality Date  . BREAST BIOPSY     left-simple ductal hyperplasia  . BREAST EXCISIONAL BIOPSY Left    benign  . CKC  1988   CIN I, ? CIS, BX-CIN 3  . HEMORROIDECTOMY    . HYSTEROSCOPY     resection, myoma, large polyps  . lung wedge resection     right  . TUBAL LIGATION      Current Outpatient Prescriptions  Medication Sig Dispense Refill  . aspirin 81 MG tablet Take 81 mg by mouth daily.    . hydrochlorothiazide (HYDRODIURIL) 12.5 MG tablet Take 1 tablet by mouth every day  4  . Multiple Vitamins-Minerals (CENTRUM SILVER ADULT 50+ PO) Take 1 tablet by mouth daily.      No current facility-administered medications for this visit.     Family History  Problem Relation Age of Onset  . Breast cancer Mother 56  . Hypertension Mother   . Breast cancer Maternal Aunt   . Heart attack Father   . Colon  cancer Sister     deceased    ROS:  Pertinent items are noted in HPI.  Otherwise, a comprehensive ROS was negative.  Exam:   BP 130/80 (BP Location: Right Arm, Patient Position: Sitting, Cuff Size: Normal)   Pulse 80   Resp 16   Ht 5' 3.25" (1.607 m)   Wt 164 lb (74.4 kg)   LMP 08/27/2009 Comment: cervical polyp  BMI 28.82 kg/m   Weight change:  -2#  Height: 5' 3.25" (160.7 cm)  Ht Readings from Last 3 Encounters:  12/28/16 5' 3.25" (1.607 m)  03/08/16 5' 4.5" (1.638 m)  12/23/15 5' 3.5" (1.613 m)    General appearance: alert, cooperative and appears stated age Head: Normocephalic, without obvious abnormality, atraumatic Neck: no adenopathy, supple, symmetrical, trachea midline and thyroid normal to inspection and palpation Lungs: clear to auscultation bilaterally Breasts: normal appearance, no masses or tenderness Heart: regular rate and rhythm Abdomen: soft, non-tender; bowel sounds normal; no masses,  no organomegaly Extremities: extremities normal, atraumatic, no cyanosis or edema Skin: Skin color, texture, turgor normal. No rashes or lesions Lymph nodes: Cervical, supraclavicular, and axillary nodes normal. No abnormal inguinal nodes palpated  Neurologic: Grossly normal   Pelvic: External genitalia:  no lesions              Urethra:  normal appearing urethra with no masses, tenderness or lesions              Bartholins and Skenes: normal                 Vagina: normal appearing vagina with normal color and discharge, no lesions              Cervix: no lesions              Pap taken: No. Bimanual Exam:  Uterus:  normal size, contour, position, consistency, mobility, non-tender              Adnexa: no mass, fullness, tenderness               Rectovaginal: Confirms               Anus:  normal sphincter tone, no lesions  Chaperone was present for exam.  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  No pap obtained.   Lab work UTD with Dr. Osborne Casco D/w pt cologard as pt has declined doing additional colonoscopy.  Information given.  return annually or prn

## 2017-04-12 ENCOUNTER — Ambulatory Visit: Payer: Medicare Other | Admitting: Obstetrics & Gynecology

## 2017-07-25 ENCOUNTER — Ambulatory Visit (INDEPENDENT_AMBULATORY_CARE_PROVIDER_SITE_OTHER): Payer: Medicare Other | Admitting: Obstetrics & Gynecology

## 2017-07-25 ENCOUNTER — Encounter: Payer: Self-pay | Admitting: Obstetrics & Gynecology

## 2017-07-25 ENCOUNTER — Telehealth: Payer: Self-pay | Admitting: Obstetrics & Gynecology

## 2017-07-25 VITALS — BP 140/90 | HR 72 | Resp 14 | Wt 163.4 lb

## 2017-07-25 DIAGNOSIS — N950 Postmenopausal bleeding: Secondary | ICD-10-CM

## 2017-07-25 NOTE — Telephone Encounter (Signed)
Patient started bleeding on Saturday.

## 2017-07-25 NOTE — Telephone Encounter (Signed)
Spoke with patient. Patient states that she had an episode of sudden vaginal bleeding. "It was all at once then spotting for the rest of the day Saturday." Reports noting some blood with wiping today. Denies any pain or heavy bleeding. Advised will need to be seen for further evaluation. Patient is agreeable. Appointment scheduled for 07/25/2017 at 10 am with Dr.Miller. Patient is agreeable to date and time.  Routing to provider for final review. Patient agreeable to disposition. Will close encounter.

## 2017-07-25 NOTE — Progress Notes (Signed)
GYNECOLOGY  VISIT  CC:   Vaginal bleeding  HPI: 71 y.o. G8P2 Single Caucasian female here for complaint of vaginal bleeding that started on Saturday.  Pt reports she did not have any cramping before of after the episode.  Bleeding seemed to "gush out" and was bright red.  It didn't really continue.  She just saw some continued spotting since Saturday.   Last pap smear was normal on 12/23/15.     GYNECOLOGIC HISTORY: Patient's last menstrual period was 08/27/2009. Contraception: BTL Menopausal hormone therapy: none  Patient Active Problem List   Diagnosis Date Noted  . 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 Essential hypertension 12/23/2015    Past Medical History:  Diagnosis Date  . Abnormal Pap smear of cervix   . Fibroids   . Hypertension   . Migraine   . Osteoporosis     Past Surgical History:  Procedure Laterality Date  . BREAST BIOPSY     left-simple ductal hyperplasia  . BREAST EXCISIONAL BIOPSY Left    benign  . CKC  1988   CIN I, ? CIS, BX-CIN 3  . HEMORROIDECTOMY    . HYSTEROSCOPY     resection, myoma, large polyps  . lung wedge resection     right  . TUBAL LIGATION      MEDS:   Current Outpatient Prescriptions on File Prior to Visit  Medication Sig Dispense Refill  . aspirin 81 MG tablet Take 81 mg by mouth daily.    . hydrochlorothiazide (HYDRODIURIL) 12.5 MG tablet Take 1 tablet by mouth every day  4  . Multiple Vitamins-Minerals (CENTRUM SILVER ADULT 50+ PO) Take 1 tablet by mouth daily.      No current facility-administered medications on file prior to visit.     ALLERGIES: Patient has no known allergies.  Family History  Problem Relation Age of Onset  . Breast cancer Mother 2  . Hypertension Mother   . Breast cancer Maternal Aunt   . Heart attack Father   . Colon cancer Sister        deceased  . Colon cancer Sister        Remission    SH:  Single, non smoker  Review of Systems  Psychiatric/Behavioral: The patient is  nervous/anxious (about appt today).   All other systems reviewed and are negative.   PHYSICAL EXAMINATION:    BP 140/90 (BP Location: Left Arm, Patient Position: Sitting, Cuff Size: Normal)   Pulse 72   Resp 14   Wt 163 lb 6.4 oz (74.1 kg)   LMP 08/27/2009 Comment: cervical polyp  BMI 28.72 kg/m     General appearance: alert, cooperative and appears stated age CV:  Regular rate and rhythm Lungs:  clear to auscultation, no wheezes, rales or rhonchi, symmetric air entry Abdomen: soft, non-tender; bowel sounds normal; no masses,  no organomegaly  Pelvic: External genitalia:  no lesions              Urethra:  normal appearing urethra with no masses, tenderness or lesions              Bartholins and Skenes: normal                 Vagina: normal appearing vagina with normal color and discharge, no lesions              Cervix: no lesions              Bimanual Exam:  Uterus:  normal size, contour, position, consistency, mobility, non-tender              Adnexa: no mass, fullness, tenderness              Rectovaginal: Yes.  .  Confirms.              Anus:  normal sphincter tone, no lesions  Endometrial biopsy recommended.  Discussed with patient.  Verbal and written consent obtained.   Procedure:  Speculum placed.  Cervix visualized and cleansed with betadine prep.  A single toothed tenaculum was applied to the anterior lip of the cervix.  Cervical stenosis noted with attempt at passing endometrial pipelle.  Milex dilator needed to dilate cervix.  Then endometrial pipelle was advanced through the cervix into the endometrial cavity without difficulty.  Pipelle passed to 7cm.  Suction applied and pipelle removed with scant tissue sample obtained.  Second pass with larger endometrial pipelle was performed with scant specimen as well noted.  Tenculum removed.  No bleeding noted.  Patient tolerated procedure well.  Chaperone was present for exam.  Assessment: PMP bleeding No HRT use H/O  CIS/CIN3 s/p CKC 1998  Plan: Endometrial biopsy results will be called to pt She will return for PUS on Thursday and to discuss results.

## 2017-07-25 NOTE — Progress Notes (Signed)
Patient scheduled for PUS while in office. Scheduled for 08/04/17 at 2pm with consult to follow at 2:30pm with Dr. Sabra Heck. Patient is agreeable to date and time.

## 2017-07-27 ENCOUNTER — Telehealth: Payer: Self-pay

## 2017-07-27 NOTE — Telephone Encounter (Signed)
-----   Message from Megan Salon, MD sent at 07/27/2017  5:01 AM EDT ----- Please let pt know her endometrial biopsy showed no abnormal cells.  She has ultrasound scheduled for tomorrow.  Thanks.  CC:  Karmen Bongo

## 2017-08-04 ENCOUNTER — Encounter: Payer: Self-pay | Admitting: Obstetrics & Gynecology

## 2017-08-04 ENCOUNTER — Ambulatory Visit (INDEPENDENT_AMBULATORY_CARE_PROVIDER_SITE_OTHER): Payer: Medicare Other

## 2017-08-04 ENCOUNTER — Ambulatory Visit (INDEPENDENT_AMBULATORY_CARE_PROVIDER_SITE_OTHER): Payer: Medicare Other | Admitting: Obstetrics & Gynecology

## 2017-08-04 VITALS — BP 140/76 | HR 80 | Resp 16 | Ht 63.25 in | Wt 163.0 lb

## 2017-08-04 DIAGNOSIS — N950 Postmenopausal bleeding: Secondary | ICD-10-CM | POA: Diagnosis not present

## 2017-08-04 DIAGNOSIS — D251 Intramural leiomyoma of uterus: Secondary | ICD-10-CM

## 2017-08-04 NOTE — Progress Notes (Signed)
71 y.o. G2P2 Single Caucasian female here for pelvic ultrasound due to PMP bleeding.  Endometrial biopsy was negative.  Bleeding has stopped now but hadn't when biopsy report was called to pt.  Therefore, ultrasound recommended.  Patient's last menstrual period was 08/27/2009.  Contraception: PMP  Findings:  UTERUS: 8.1 x 6.3 x 5.5cm with multiple fibroids noted measuring 2.8 x 3.0cm, 2.6 x 2.4xm, 1.0 x 0.9cm, 3.8 x 4.5cm EMS: distorted with possible submucosal fibroid vs encroachment of fibroid, 15mm ADNEXA: Left ovary: 1.4 x 1.0 x 0.7cm       Right ovary: 2.1 x 1.7 x 1.2cm CUL DE SAC: no free fluid  Discussion:  Findings reviewed.  As long as she has no further bleeding, I think this is safe to watched.  She knows to call if has any future bleeding.  If bleeding occurs again, would recommend hysteroscopy at that time.  Procedure discussed.    Assessment:  PMP bleeding Think endometrium  Plan:  Pt knows to call with any future bleeding.  ~15 minutes spent with patient >50% of time was in face to face discussion of above.

## 2017-08-07 ENCOUNTER — Encounter: Payer: Self-pay | Admitting: Obstetrics & Gynecology

## 2017-08-07 DIAGNOSIS — D251 Intramural leiomyoma of uterus: Secondary | ICD-10-CM | POA: Insufficient documentation

## 2017-08-08 NOTE — Telephone Encounter (Signed)
Patient was seen on 08/04/2017 and results were reviewed. Encounter closed.

## 2017-09-26 ENCOUNTER — Other Ambulatory Visit: Payer: Self-pay | Admitting: Internal Medicine

## 2017-09-26 DIAGNOSIS — Z1231 Encounter for screening mammogram for malignant neoplasm of breast: Secondary | ICD-10-CM

## 2017-12-28 ENCOUNTER — Ambulatory Visit
Admission: RE | Admit: 2017-12-28 | Discharge: 2017-12-28 | Disposition: A | Discharge status: Home / Self Care | Payer: Medicare Other | Source: Ambulatory Visit | Attending: Internal Medicine | Admitting: Internal Medicine

## 2017-12-28 DIAGNOSIS — Z1231 Encounter for screening mammogram for malignant neoplasm of breast: Secondary | ICD-10-CM

## 2018-03-28 ENCOUNTER — Encounter: Payer: Self-pay | Admitting: Obstetrics & Gynecology

## 2018-03-28 ENCOUNTER — Encounter

## 2018-03-28 ENCOUNTER — Other Ambulatory Visit (HOSPITAL_COMMUNITY)
Admission: RE | Admit: 2018-03-28 | Discharge: 2018-03-28 | Disposition: A | Discharge status: Home / Self Care | Payer: Medicare Other | Source: Ambulatory Visit | Attending: Obstetrics & Gynecology | Admitting: Obstetrics & Gynecology

## 2018-03-28 ENCOUNTER — Other Ambulatory Visit: Payer: Self-pay

## 2018-03-28 ENCOUNTER — Ambulatory Visit (INDEPENDENT_AMBULATORY_CARE_PROVIDER_SITE_OTHER): Payer: Medicare Other | Admitting: Obstetrics & Gynecology

## 2018-03-28 VITALS — BP 120/64 | HR 66 | Resp 14 | Ht 63.5 in | Wt 164.0 lb

## 2018-03-28 DIAGNOSIS — Z124 Encounter for screening for malignant neoplasm of cervix: Secondary | ICD-10-CM | POA: Insufficient documentation

## 2018-03-28 DIAGNOSIS — Z01419 Encounter for gynecological examination (general) (routine) without abnormal findings: Secondary | ICD-10-CM

## 2018-03-28 NOTE — Progress Notes (Addendum)
72 y.o. G2P2 Single Caucasian F here for annual exam.  Doing well.  Denies vaginal bleeding.    PCP:  Dr. Osborne Casco.  Last appt was in October, 2018.  Did blood work at that visit.  Patient's last menstrual period was 08/27/2009.          Sexually active: No.  The current method of family planning is post menopausal status.    Exercising: Yes.    walking Smoker:  no  Health Maintenance: Pap:  12-23-15 negative          10-22-14 negative  History of abnormal Pap:  yes MMG:  12-28-17 BIRADS 1 negative  Colonoscopy:  12/2004 Normal. Pt declines f/u. Did test for blood in stool with Dr. Osborne Casco.  Did discussed Cologuard today.   BMD:   2018 with PCP osteopenia per patient TDaP:  05-28-13  Pneumonia vaccine(s):  07-19-14 Shingrix:   2014.  D/w pt Shingrix vaccination.   Hep C testing: PCP Screening Labs: PCP, Hb today: PCP, Urine today: not collected   reports that she has never smoked. She has never used smokeless tobacco. She reports that she drinks about 4.2 oz of alcohol per week. She reports that she does not use drugs.  Past Medical History:  Diagnosis Date  . Abnormal Pap smear of cervix   . Fibroids   . Hypertension   . Migraine   . Osteoporosis     Past Surgical History:  Procedure Laterality Date  . BREAST BIOPSY Left    left-simple ductal hyperplasia  . BREAST EXCISIONAL BIOPSY Left   . CKC  1988   CIN I, ? CIS, BX-CIN 3  . HEMORROIDECTOMY    . HYSTEROSCOPY     resection, myoma, large polyps  . lung wedge resection     right  . TUBAL LIGATION  1973    Current Outpatient Medications  Medication Sig Dispense Refill  . alendronate (FOSAMAX) 70 MG tablet Take 1 tablet once a week by mouth.  0  . aspirin 81 MG tablet Take 81 mg by mouth daily.    . hydrochlorothiazide (HYDRODIURIL) 12.5 MG tablet Take 1 tablet by mouth every day  4  . Multiple Vitamins-Minerals (CENTRUM SILVER ADULT 50+ PO) Take 1 tablet by mouth daily.      No current facility-administered  medications for this visit.     Family History  Problem Relation Age of Onset  . Breast cancer Mother 12  . Hypertension Mother   . Breast cancer Maternal Aunt   . Heart attack Father   . Colon cancer Sister        deceased  . Colon cancer Sister        Remission    Review of Systems  Genitourinary: Positive for frequency.       Night urination    All other systems reviewed and are negative.   Exam:   BP 120/64 (BP Location: Right Arm, Patient Position: Sitting, Cuff Size: Large)   Pulse 66   Resp 14   Ht 5' 3.5" (1.613 m)   Wt 164 lb (74.4 kg)   LMP 08/27/2009 Comment: cervical polyp  BMI 28.60 kg/m   Height: 5' 3.5" (161.3 cm)  Ht Readings from Last 3 Encounters:  03/28/18 5' 3.5" (1.613 m)  08/04/17 5' 3.25" (1.607 m)  12/28/16 5' 3.25" (1.607 m)    General appearance: alert, cooperative and appears stated age Head: Normocephalic, without obvious abnormality, atraumatic Neck: no adenopathy, supple, symmetrical, trachea midline and thyroid  normal to inspection and palpation Lungs: clear to auscultation bilaterally Breasts: normal appearance, no masses or tenderness Heart: regular rate and rhythm Abdomen: soft, non-tender; bowel sounds normal; no masses,  no organomegaly Extremities: extremities normal, atraumatic, no cyanosis or edema Skin: Skin color, texture, turgor normal. No rashes or lesions Lymph nodes: Cervical, supraclavicular, and axillary nodes normal. No abnormal inguinal nodes palpated Neurologic: Grossly normal   Pelvic: External genitalia:  no lesions              Urethra:  normal appearing urethra with no masses, tenderness or lesions              Bartholins and Skenes: normal                 Vagina: normal appearing vagina with normal color and discharge, no lesions, 3rd degree cystocele              Cervix: no lesions              Pap taken: Yes.   Bimanual Exam:  Uterus:  normal size, contour, position, consistency, mobility, non-tender               Adnexa: normal adnexa and no mass, fullness, tenderness               Rectovaginal: Confirms               Anus:  normal sphincter tone, no lesions  Chaperone was present for exam.  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 Osborne Casco Discussed with pt cologuard today as she is not interested in having any additional colonoscopies Shingrix discussed today.  Declined for now. D/w pt referral to neurology, specifically Dr. Carles Collet.  Wants to discuss with Dr. Osborne Casco. Return annually or prn

## 2018-03-31 LAB — CYTOLOGY - PAP: Diagnosis: NEGATIVE

## 2018-04-06 ENCOUNTER — Other Ambulatory Visit: Payer: Self-pay | Admitting: *Deleted

## 2018-04-06 MED ORDER — FLUCONAZOLE 150 MG PO TABS: 150.0000 mg | ORAL_TABLET | Freq: Once | ORAL | 0 refills | Status: AC

## 2018-04-12 ENCOUNTER — Encounter

## 2018-06-26 ENCOUNTER — Other Ambulatory Visit: Payer: Self-pay | Admitting: Internal Medicine

## 2018-06-26 DIAGNOSIS — Z1231 Encounter for screening mammogram for malignant neoplasm of breast: Secondary | ICD-10-CM

## 2019-01-01 ENCOUNTER — Ambulatory Visit: Payer: Medicare Other

## 2019-01-19 ENCOUNTER — Other Ambulatory Visit: Payer: Self-pay

## 2019-01-19 ENCOUNTER — Emergency Department (HOSPITAL_COMMUNITY)
Admission: EM | Admit: 2019-01-19 | Discharge: 2019-01-19 | Disposition: A | Discharge status: Home / Self Care | Payer: Medicare Other | Attending: Emergency Medicine | Admitting: Emergency Medicine

## 2019-01-19 ENCOUNTER — Encounter (HOSPITAL_COMMUNITY): Payer: Self-pay

## 2019-01-19 DIAGNOSIS — Z79899 Other long term (current) drug therapy: Secondary | ICD-10-CM | POA: Diagnosis not present

## 2019-01-19 DIAGNOSIS — R197 Diarrhea, unspecified: Secondary | ICD-10-CM | POA: Insufficient documentation

## 2019-01-19 DIAGNOSIS — I10 Essential (primary) hypertension: Secondary | ICD-10-CM | POA: Insufficient documentation

## 2019-01-19 DIAGNOSIS — Z7982 Long term (current) use of aspirin: Secondary | ICD-10-CM | POA: Insufficient documentation

## 2019-01-19 DIAGNOSIS — N390 Urinary tract infection, site not specified: Secondary | ICD-10-CM | POA: Diagnosis not present

## 2019-01-19 DIAGNOSIS — E860 Dehydration: Secondary | ICD-10-CM | POA: Diagnosis not present

## 2019-01-19 LAB — URINALYSIS, ROUTINE W REFLEX MICROSCOPIC
Bilirubin Urine: NEGATIVE
Glucose, UA: NEGATIVE mg/dL
Ketones, ur: 80 mg/dL — AB
Nitrite: POSITIVE — AB
Protein, ur: NEGATIVE mg/dL
Specific Gravity, Urine: 1.021 (ref 1.005–1.030)
pH: 6 (ref 5.0–8.0)

## 2019-01-19 LAB — COMPREHENSIVE METABOLIC PANEL
ALT: 17 U/L (ref 0–44)
AST: 22 U/L (ref 15–41)
Albumin: 4.9 g/dL (ref 3.5–5.0)
Alkaline Phosphatase: 72 U/L (ref 38–126)
Anion gap: 14 (ref 5–15)
BUN: 24 mg/dL — ABNORMAL HIGH (ref 8–23)
CO2: 21 mmol/L — ABNORMAL LOW (ref 22–32)
Calcium: 9.1 mg/dL (ref 8.9–10.3)
Chloride: 105 mmol/L (ref 98–111)
Creatinine, Ser: 0.65 mg/dL (ref 0.44–1.00)
GFR calc Af Amer: >60 mL/min (ref >60)
GFR calc non Af Amer: >60 mL/min (ref >60)
Glucose, Bld: 138 mg/dL — ABNORMAL HIGH (ref 70–99)
Potassium: 3.5 mmol/L (ref 3.5–5.1)
Sodium: 140 mmol/L (ref 135–145)
Total Bilirubin: 1 mg/dL (ref 0.3–1.2)
Total Protein: 7.9 g/dL (ref 6.5–8.1)

## 2019-01-19 LAB — CBC WITH DIFFERENTIAL/PLATELET
Abs Immature Granulocytes: 0.02 10*3/uL (ref 0.00–0.07)
Basophils Absolute: 0 10*3/uL (ref 0.0–0.1)
Basophils Relative: 0 %
Eosinophils Absolute: 0 10*3/uL (ref 0.0–0.5)
Eosinophils Relative: 0 %
HCT: 50.7 % — ABNORMAL HIGH (ref 36.0–46.0)
Hemoglobin: 16.8 g/dL — ABNORMAL HIGH (ref 12.0–15.0)
Immature Granulocytes: 0 %
Lymphocytes Relative: 3 %
Lymphs Abs: 0.3 10*3/uL — ABNORMAL LOW (ref 0.7–4.0)
MCH: 31.3 pg (ref 26.0–34.0)
MCHC: 33.1 g/dL (ref 30.0–36.0)
MCV: 94.4 fL (ref 80.0–100.0)
Monocytes Absolute: 0.4 10*3/uL (ref 0.1–1.0)
Monocytes Relative: 4 %
Neutro Abs: 10.1 10*3/uL — ABNORMAL HIGH (ref 1.7–7.7)
Neutrophils Relative %: 93 %
Platelets: 210 10*3/uL (ref 150–400)
RBC: 5.37 MIL/uL — ABNORMAL HIGH (ref 3.87–5.11)
RDW: 13.3 % (ref 11.5–15.5)
WBC: 10.8 10*3/uL — ABNORMAL HIGH (ref 4.0–10.5)
nRBC: 0 % (ref 0.0–0.2)

## 2019-01-19 MED ORDER — ONDANSETRON HCL 4 MG/2ML IJ SOLN
4.0000 mg | Freq: Once | INTRAMUSCULAR | Status: AC
  Administered 2019-01-19: 4 mg via INTRAVENOUS
  Filled 2019-01-19: qty 2

## 2019-01-19 MED ORDER — SODIUM CHLORIDE 0.9 % IV BOLUS
1000.0000 mL | Freq: Once | INTRAVENOUS | Status: AC
  Administered 2019-01-19: 1000 mL via INTRAVENOUS

## 2019-01-19 MED ORDER — CEPHALEXIN 250 MG PO CAPS
250.0000 mg | ORAL_CAPSULE | Freq: Three times a day (TID) | ORAL | Status: DC
  Administered 2019-01-19: 250 mg via ORAL
  Filled 2019-01-19: qty 1

## 2019-01-19 MED ORDER — SODIUM CHLORIDE 0.9 % IV SOLN
1.0000 g | Freq: Once | INTRAVENOUS | Status: AC
  Administered 2019-01-19: 1 g via INTRAVENOUS
  Filled 2019-01-19: qty 10

## 2019-01-19 MED ORDER — SODIUM CHLORIDE 0.9 % IV BOLUS
1000.0000 mL | Freq: Once | INTRAVENOUS | Status: AC
  Administered 2019-01-19: 20:00:00 1000 mL via INTRAVENOUS

## 2019-01-19 NOTE — ED Provider Notes (Signed)
Patient signout from Dr. Ayesha Rumpf.  73 year old female here with acute onset of vomiting and diarrhea with generalized weakness of 10 this morning. Physical Exam  BP (!) 146/81   Pulse (!) 108   Temp 98 F (36.7 C) (Oral)   Resp 17   Ht 5\' 4"  (1.626 m)   Wt 75.3 kg   LMP 08/27/2009 Comment: cervical polyp  SpO2 98%   BMI 28.49 kg/m   Physical Exam  ED Course/Procedures   Clinical Course as of Jan 20 1003  Fri Jan 19, 2019  2002 Patient's urinalysis looks possibly infected with 21-50 whites and nitrite positive although she does have 11-20 squames.  We will cover with a dose of IV antibiotics.  She still showing 80 ketones have ordered her another liter of fluid.  She said she is feeling better and is hoping to go home and her daughter would come pick her up and were ready.   [MB]    Clinical Course User Index [MB] Hayden Rasmussen, MD    Procedures  MDM  Signout plan is to follow-up on her EKG and urinalysis and if these are unremarkable I have a trial ambulation to see if she is able to return home.       Hayden Rasmussen, MD 01/20/19 1005

## 2019-01-19 NOTE — ED Provider Notes (Signed)
[facility] DEPT Provider Note   CSN: [id number] Arrival date & time: 01/19/19  1357    History   Chief Complaint Chief Complaint  Patient presents with  . Diarrhea  . Emesis    HPI Terry Marsh is a 73 y.o. female.     The history is provided by the patient and medical records. No language interpreter was used.  Diarrhea  Associated symptoms: vomiting   Emesis  Associated symptoms: diarrhea    Terry Marsh is a 73 y.o. female who presents to the Emergency Department complaining of vomiting and diarrhea. She presents to the emergency department by EMS from home for evaluation of vomiting and diarrhea that began at 10 AM this morning. Prior to symptoms beginning she was in her routine state of health. She states that things began abruptly and she has experienced numerous episodes of vomiting and diarrhea and she is been unable to leave the commode. She denies any fevers, headache, chest pain, shortness of breath, abdominal pain, dysuria, hematochezia, melena, hematemesis. No prior similar symptoms. No recent sick contacts or bad food exposures. She has a history of hypertension, no additional medical problems. Past Medical History:  Diagnosis Date  . Abnormal Pap smear of cervix   . Fibroids   . Hypertension   . Migraine   . Osteoporosis     Patient Active Problem List   Diagnosis Date Noted  . Fibroids, intramural 08/07/2017  . 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 Essential hypertension 12/23/2015    Past Surgical History:  Procedure Laterality Date  . BREAST BIOPSY Left    left-simple ductal hyperplasia  . BREAST EXCISIONAL BIOPSY Left   . CKC  1988   CIN I, ? CIS, BX-CIN 3  . HEMORROIDECTOMY    . HYSTEROSCOPY     resection, myoma, large polyps  . lung wedge resection     right  . TUBAL LIGATION  1973     OB History    Gravida  2   Para  2   Term      Preterm      AB      Living  2     SAB      TAB      Ectopic      Multiple      Live Births               Home Medications    Prior to Admission medications   Medication Sig Start Date End Date Taking? Authorizing Provider  alendronate (FOSAMAX) 70 MG tablet Take 1 tablet once a week by mouth. 07/28/17   [provider]  aspirin 81 MG tablet Take 81 mg by mouth daily.    [provider]  hydrochlorothiazide (HYDRODIURIL) 12.5 MG tablet Take 1 tablet by mouth every day 12/16/15   [provider]  Multiple Vitamins-Minerals (CENTRUM SILVER ADULT 50+ PO) Take 1 tablet by mouth daily.     [provider]    Family History Family History  Problem Relation Age of Onset  . Breast cancer Mother 74  . Hypertension Mother   . Breast cancer Maternal Aunt   . Heart attack Father   . Colon cancer Sister        deceased  . Colon cancer Sister        Remission    Social History Social History   Tobacco Use  . Smoking status: Never Smoker  . Smokeless  tobacco: Never Used  Substance Use Topics  . Alcohol use: Yes    Alcohol/week: 7.0 standard drinks    Types: 7 Glasses of wine per week    Comment: red wine once daily  . Drug use: No     Allergies   Patient has no known allergies.   Review of Systems Review of Systems  Gastrointestinal: Positive for diarrhea and vomiting.  All other systems reviewed and are negative.    Physical Exam Updated Vital Signs BP (!) 146/81   Pulse (!) 108   Temp 98 F (36.7 C) (Oral)   Resp 17   Ht 5\' 4"  (1.626 m)   Wt 75.3 kg   LMP 08/27/2009 Comment: cervical polyp  SpO2 98%   BMI 28.49 kg/m   Physical Exam Vitals signs and nursing note reviewed.  Constitutional:      Appearance: She is well-developed.  HENT:     Head: Normocephalic and atraumatic.  Cardiovascular:     Rate and Rhythm: Regular rhythm.     Heart sounds: No murmur.     Comments: tachcyardic Pulmonary:     Effort: Pulmonary effort is normal. No  respiratory distress.     Breath sounds: Normal breath sounds.  Abdominal:     Palpations: Abdomen is soft.     Tenderness: There is no abdominal tenderness. There is no guarding or rebound.  Musculoskeletal:        General: No swelling or tenderness.  Skin:    General: Skin is warm and dry.  Neurological:     Mental Status: She is alert and oriented to person, place, and time.  Psychiatric:        Behavior: Behavior normal.      ED Treatments / Results  Labs (all labs ordered are listed, but only abnormal results are displayed) Labs Reviewed  COMPREHENSIVE METABOLIC PANEL - Abnormal; Notable for the following components:      Result Value   CO2 21 (*)    Glucose, Bld 138 (*)    BUN 24 (*)    All other components within normal limits  CBC WITH DIFFERENTIAL/PLATELET - Abnormal; Notable for the following components:   WBC 10.8 (*)    RBC 5.37 (*)    Hemoglobin 16.8 (*)    HCT 50.7 (*)    Neutro Abs 10.1 (*)    Lymphs Abs 0.3 (*)    All other components within normal limits  URINALYSIS, ROUTINE W REFLEX MICROSCOPIC    EKG None  Radiology No results found.  Procedures Procedures (including critical care time)  Medications Ordered in ED Medications  sodium chloride 0.9 % bolus 1,000 mL (0 mLs Intravenous Stopped 01/19/19 1622)  ondansetron (ZOFRAN) injection 4 mg (4 mg Intravenous Given 01/19/19 1619)     Initial Impression / Assessment and Plan / ED Course  I have reviewed the triage vital signs and the nursing notes.  Pertinent labs & imaging results that were available during my care of the patient were reviewed by me and considered in my medical decision making (see chart for details).        Patient here for evaluation of vomiting and diarrhea that began about 10 AM this morning. Labs are consistent with mild dehydration and she was treated with IV fluids and antiemetics. On recheck she is able to tolerate oral fluids without difficulty. Abdominal  examination on reassessment is soft and nontender. Current presentation is not consistent with sepsis, C diff. Patient care transferred pending urinalysis and  trial of ambulation.  Final Clinical Impressions(s) / ED Diagnoses   Final diagnoses:  None    ED Discharge Orders    None       Quintella Reichert, MD 01/19/19 1650

## 2019-01-19 NOTE — ED Notes (Signed)
Pt had a small BM, liquid consistency, trace blood. Pt cleaned, pads changed, and Purewick put in place by NT

## 2019-01-19 NOTE — ED Notes (Signed)
Pt unable to urinate at this time. Pure wick placed... nurse aware.

## 2019-01-19 NOTE — Discharge Instructions (Addendum)
You were seen in the emergency department for diarrhea and weakness.  Your symptoms improved with IV fluids.  During your testing you were found to have a urine infection and we are sending you home with a prescription for an antibiotic to finish 1 week.  Please drink plenty of fluids.  Make a follow-up with your primary care doctor and return if any worsening symptoms.

## 2019-01-19 NOTE — ED Notes (Signed)
Unsuccessful IV start x2.  

## 2019-01-22 LAB — URINE CULTURE: Culture: >=100000 — AB

## 2019-01-23 ENCOUNTER — Telehealth: Payer: Self-pay | Admitting: Emergency Medicine

## 2019-01-23 NOTE — Telephone Encounter (Signed)
Post ED Visit - Positive Culture Follow-up: Successful Patient Follow-Up  Culture assessed and recommendations reviewed by:  []  Elenor Quinones, Pharm.D. []  Heide Guile, Pharm.D., BCPS AQ-ID []  Parks Neptune, Pharm.D., BCPS []  Alycia Rossetti, Pharm.D., BCPS []  [place], [place].D., BCPS, AAHIVP []  Legrand Como, Pharm.D., BCPS, AAHIVP []  Salome Arnt, PharmD, BCPS []  Johnnette Gourd, PharmD, BCPS []  Hughes Better, PharmD, BCPS []  Leeroy Cha, PharmD Dia Sitter PharmD  Positive urine culture  [x]  Patient discharged without antimicrobial prescription and treatment is now indicated []  Organism is resistant to prescribed ED discharge antimicrobial []  Patient with positive blood cultures  Changes discussed with ED provider: Mila Merry MD New antibiotic prescription start Keflex 500mg  po bid x 5 days  Attempting to contact patient   Hazle Nordmann 01/23/2019, 1:08 PM

## 2019-02-14 ENCOUNTER — Ambulatory Visit
Admission: RE | Admit: 2019-02-14 | Discharge: 2019-02-14 | Disposition: A | Discharge status: Home / Self Care | Payer: Medicare Other | Source: Ambulatory Visit | Attending: Internal Medicine | Admitting: Internal Medicine

## 2019-02-14 ENCOUNTER — Other Ambulatory Visit: Payer: Self-pay

## 2019-02-14 DIAGNOSIS — Z1231 Encounter for screening mammogram for malignant neoplasm of breast: Secondary | ICD-10-CM

## 2019-02-16 ENCOUNTER — Ambulatory Visit: Payer: Medicare Other

## 2019-07-19 ENCOUNTER — Ambulatory Visit: Payer: Medicare Other | Admitting: Obstetrics & Gynecology

## 2019-10-05 ENCOUNTER — Other Ambulatory Visit: Payer: Self-pay | Admitting: Family Medicine

## 2019-10-09 ENCOUNTER — Other Ambulatory Visit: Payer: Self-pay | Admitting: Family Medicine

## 2019-10-09 DIAGNOSIS — M810 Age-related osteoporosis without current pathological fracture: Secondary | ICD-10-CM

## 2019-10-09 DIAGNOSIS — Z1231 Encounter for screening mammogram for malignant neoplasm of breast: Secondary | ICD-10-CM

## 2019-10-24 ENCOUNTER — Ambulatory Visit: Payer: Medicare Other

## 2019-11-02 ENCOUNTER — Ambulatory Visit: Payer: Medicare Other | Attending: Internal Medicine

## 2019-11-02 DIAGNOSIS — Z23 Encounter for immunization: Secondary | ICD-10-CM | POA: Insufficient documentation

## 2019-11-02 NOTE — Progress Notes (Signed)
   Covid-19 Vaccination Clinic  Name:  Terry Marsh    MRN: [id number] DOB: 1946-01-12  11/02/2019  [name] was observed post Covid-19 immunization for 15 minutes without incidence. She was provided with Vaccine Information Sheet and instruction to access the V-Safe system.   [name] was instructed to call 911 with any severe reactions post vaccine: [name] Kitchen Difficulty breathing  . Swelling of your face and throat  . A fast heartbeat  . A bad rash all over your body  . Dizziness and weakness    Immunizations Administered    Name Date Dose VIS Date Route   Pfizer COVID-19 Vaccine 11/02/2019 11:26 AM 0.3 mL 09/07/2019 Intramuscular   Manufacturer: [place]   Lot: [id number]   [place]: [id number]

## 2019-11-13 ENCOUNTER — Ambulatory Visit: Payer: Medicare Other

## 2019-11-14 ENCOUNTER — Ambulatory Visit: Payer: Medicare Other

## 2019-11-27 ENCOUNTER — Ambulatory Visit: Payer: Medicare Other | Attending: Internal Medicine

## 2019-11-27 DIAGNOSIS — Z23 Encounter for immunization: Secondary | ICD-10-CM | POA: Insufficient documentation

## 2019-11-27 NOTE — Progress Notes (Signed)
   Covid-19 Vaccination Clinic  Name:  Terry Marsh    MRN: [id number] DOB: Sep 27, 1946  11/27/2019  [name] was observed post Covid-19 immunization for 15 minutes without incident. She was provided with Vaccine Information Sheet and instruction to access the V-Safe system.   [name] was instructed to call 911 with any severe reactions post vaccine: [name] Kitchen Difficulty breathing  . Swelling of face and throat  . A fast heartbeat  . A bad rash all over body  . Dizziness and weakness   Immunizations Administered    Name Date Dose VIS Date Route   Pfizer COVID-19 Vaccine 11/27/2019 11:53 AM 0.3 mL 09/07/2019 Intramuscular   Manufacturer: [place]   Lot: [id number]   [place]: [id number]

## 2020-02-15 ENCOUNTER — Ambulatory Visit
Admission: RE | Admit: 2020-02-15 | Discharge: 2020-02-15 | Disposition: A | Discharge status: Home / Self Care | Payer: Medicare Other | Source: Ambulatory Visit | Attending: Family Medicine | Admitting: Family Medicine

## 2020-02-15 ENCOUNTER — Other Ambulatory Visit: Payer: Self-pay

## 2020-02-15 DIAGNOSIS — M810 Age-related osteoporosis without current pathological fracture: Secondary | ICD-10-CM

## 2020-02-15 DIAGNOSIS — Z1231 Encounter for screening mammogram for malignant neoplasm of breast: Secondary | ICD-10-CM

## 2020-04-03 NOTE — Progress Notes (Signed)
74 y.o. [place] or Caucasian female here for annual exam.  Doing well.  Denies vaginal bleeding.      Has been on Fosamax this past year.  BMD was done 01/2020 with t score -2.3  She is doing well with this.    New PCP: Kathyrn Lass at [place]  Patient's last menstrual period was 08/27/2009.          Sexually active: No.  The current method of family planning is post menopausal status & BTL.    Exercising: Yes.    walking Smoker:  no  Health Maintenance: Pap:  12-23-15 neg, 03-28-18 neg History of abnormal Pap:  yes MMG:  02-18-2020 category b density birads 1:neg Colonoscopy:  12/2004 normal, declines f/u, per patient did cologuard neg ~75yrs ago.  Did with [employer].   BMD:   02-15-2020.  T score -2.3.   TDaP:  2014 Pneumonia vaccine(s):  2015 Shingrix:   Zoster 2014 Hep C testing: done with pcp Screening Labs: done with Dr. Kathyrn Lass   reports that she has never smoked. She has never used smokeless tobacco. She reports current alcohol use of about 7.0 standard drinks of alcohol per week. She reports that she does not use drugs.  Past Medical History:  Diagnosis Date  . Abnormal Pap smear of cervix   . Fibroids   . Hypertension   . Migraine   . Osteoporosis     Past Surgical History:  Procedure Laterality Date  . BREAST BIOPSY Left    left-simple ductal hyperplasia  . BREAST EXCISIONAL BIOPSY Left   . CKC  1988   CIN I, ? CIS, BX-CIN 3  . HEMORROIDECTOMY    . HYSTEROSCOPY     resection, myoma, large polyps  . lung wedge resection     right  . TUBAL LIGATION  1973    Current Outpatient Medications  Medication Sig Dispense Refill  . alendronate (FOSAMAX) 70 MG tablet Take 1 tablet once a week by mouth.  0  . aspirin 81 MG tablet Take 81 mg by mouth daily.    [name] Kitchen losartan (COZAAR) 25 MG tablet Take 25 mg by mouth daily.    . Multiple Vitamins-Minerals (CENTRUM SILVER ADULT 50+ PO) Take 1 tablet by mouth daily.      No current facility-administered medications for  this visit.    Family History  Problem Relation Age of Onset  . Breast cancer Mother 34  . Hypertension Mother   . Breast cancer Maternal Aunt   . Heart attack Father   . Colon cancer Sister        deceased  . Colon cancer Sister        Remission    Review of Systems  Constitutional: Negative.   HENT: Negative.   Eyes: Negative.   Respiratory: Negative.   Cardiovascular: Negative.   Gastrointestinal: Negative.   Endocrine: Negative.   Genitourinary: Negative.   Musculoskeletal: Negative.   Skin: Negative.   Allergic/Immunologic: Negative.   Neurological: Negative.   Hematological: Negative.   Psychiatric/Behavioral: Negative.     Exam:   BP 120/78   Pulse 70   Resp 16   Ht 5' 3.25" (1.607 m)   Wt 164 lb (74.4 kg)   LMP 08/27/2009 Comment: cervical polyp  BMI 28.82 kg/m   Height: 5' 3.25" (160.7 cm)  General appearance: alert, cooperative and appears stated age Head: Normocephalic, without obvious abnormality, atraumatic Neck: no adenopathy, supple, symmetrical, trachea midline and thyroid normal to inspection  and palpation Lungs: clear to auscultation bilaterally Breasts: normal appearance, no masses or tenderness Heart: regular rate and rhythm Abdomen: soft, non-tender; bowel sounds normal; no masses,  no organomegaly Extremities: extremities normal, atraumatic, no cyanosis or edema Skin: Skin color, texture, turgor normal. No rashes or lesions Lymph nodes: Cervical, supraclavicular, and axillary nodes normal. No abnormal inguinal nodes palpated Neurologic: Grossly normal   Pelvic: External genitalia:  no lesions              Urethra:  normal appearing urethra with no masses, tenderness or lesions              Bartholins and Skenes: normal                 Vagina: normal appearing vagina with normal color and discharge, no lesions              Cervix: no lesions              Pap taken: No. Bimanual Exam:  Uterus:  normal size, contour, position,  consistency, mobility, non-tender              Adnexa: normal adnexa and no mass, fullness, tenderness               Rectovaginal: Confirms               Anus:  normal sphincter tone, no lesions  Chaperone, Royal Hawthorn, CMA, was present for exam.  A:  Well Woman with normal exam PMP, no HRT H/o uterine fibroids H/O CIN 3 s/p LEEP 1988 H/o Osteoporosis, now osteopenia, on Fosamax 3rd degree cystocele Hypertension Tremor, d/w pt seeing neurology at last visit.  Declined.    P:   Mammogram guidelines reviewed pap smear guidelines reviewed.  Even with dysplasia hx, not indicated today Cologuard done 2019 per pt Vaccines reviewed.  Pt has received both pneumonia vaccines although I do not have dates for this.   Lab work done with Dr. Kathyrn Lass return annually or prn

## 2020-04-07 ENCOUNTER — Other Ambulatory Visit: Payer: Self-pay

## 2020-04-07 ENCOUNTER — Ambulatory Visit (INDEPENDENT_AMBULATORY_CARE_PROVIDER_SITE_OTHER): Payer: Medicare Other | Admitting: Obstetrics & Gynecology

## 2020-04-07 ENCOUNTER — Encounter: Payer: Self-pay | Admitting: Obstetrics & Gynecology

## 2020-04-07 VITALS — BP 120/78 | HR 70 | Resp 16 | Ht 63.25 in | Wt 164.0 lb

## 2020-04-07 DIAGNOSIS — Z01419 Encounter for gynecological examination (general) (routine) without abnormal findings: Secondary | ICD-10-CM | POA: Diagnosis not present

## 2020-09-12 ENCOUNTER — Ambulatory Visit: Payer: Medicare Other | Attending: Internal Medicine

## 2020-09-12 DIAGNOSIS — Z23 Encounter for immunization: Secondary | ICD-10-CM

## 2020-09-12 NOTE — Progress Notes (Signed)
   Covid-19 Vaccination Clinic  Name:  Terry Marsh    MRN: [id number] DOB: [birth year]  09/12/2020  [name] was observed post Covid-19 immunization for 15 minutes without incident. She was provided with Vaccine Information Sheet and instruction to access the V-Safe system.   [name] was instructed to call 911 with any severe reactions post vaccine: [name] Kitchen Difficulty breathing  . Swelling of face and throat  . A fast heartbeat  . A bad rash all over body  . Dizziness and weakness   Immunizations Administered    Name Date Dose VIS Date Route   Pfizer COVID-19 Vaccine 09/12/2020  3:20 PM 0.3 mL 07/16/2020 Intramuscular   Manufacturer: [place]   Lot: [id number]   [place]: [id number]

## 2020-09-15 ENCOUNTER — Other Ambulatory Visit: Payer: Self-pay | Admitting: Family Medicine

## 2020-09-15 DIAGNOSIS — Z1231 Encounter for screening mammogram for malignant neoplasm of breast: Secondary | ICD-10-CM

## 2021-02-16 ENCOUNTER — Ambulatory Visit
Admission: RE | Admit: 2021-02-16 | Discharge: 2021-02-16 | Disposition: A | Discharge status: Home / Self Care | Payer: Medicare Other | Source: Ambulatory Visit | Attending: Family Medicine | Admitting: Family Medicine

## 2021-02-16 ENCOUNTER — Other Ambulatory Visit: Payer: Self-pay

## 2021-02-16 DIAGNOSIS — Z1231 Encounter for screening mammogram for malignant neoplasm of breast: Secondary | ICD-10-CM

## 2021-10-19 ENCOUNTER — Other Ambulatory Visit: Payer: Self-pay | Admitting: Family Medicine

## 2021-10-19 DIAGNOSIS — M8580 Other specified disorders of bone density and structure, unspecified site: Secondary | ICD-10-CM

## 2021-10-28 ENCOUNTER — Other Ambulatory Visit: Payer: Self-pay | Admitting: Family Medicine

## 2021-10-28 DIAGNOSIS — Z1231 Encounter for screening mammogram for malignant neoplasm of breast: Secondary | ICD-10-CM

## 2021-11-09 ENCOUNTER — Other Ambulatory Visit (HOSPITAL_COMMUNITY): Payer: Self-pay | Admitting: Family Medicine

## 2021-11-09 DIAGNOSIS — R011 Cardiac murmur, unspecified: Secondary | ICD-10-CM

## 2021-11-23 ENCOUNTER — Ambulatory Visit (HOSPITAL_COMMUNITY)
Admission: RE | Admit: 2021-11-23 | Discharge: 2021-11-23 | Disposition: A | Discharge status: Home / Self Care | Payer: Medicare Other | Source: Ambulatory Visit | Attending: Family Medicine | Admitting: Family Medicine

## 2021-11-23 ENCOUNTER — Other Ambulatory Visit: Payer: Self-pay

## 2021-11-23 DIAGNOSIS — I358 Other nonrheumatic aortic valve disorders: Secondary | ICD-10-CM | POA: Diagnosis not present

## 2021-11-23 DIAGNOSIS — R011 Cardiac murmur, unspecified: Secondary | ICD-10-CM

## 2021-11-23 DIAGNOSIS — I10 Essential (primary) hypertension: Secondary | ICD-10-CM | POA: Diagnosis not present

## 2021-11-23 LAB — ECHOCARDIOGRAM COMPLETE
AR max vel: 1.46 cm2
AV Area VTI: 1.48 cm2
AV Area mean vel: 1.41 cm2
AV Mean grad: 10.5 mmHg
AV Peak grad: 18.8 mmHg
Ao pk vel: 2.17 m/s
Area-P 1/2: 3.83 cm2
S' Lateral: 2.6 cm

## 2022-03-24 ENCOUNTER — Ambulatory Visit
Admission: RE | Admit: 2022-03-24 | Discharge: 2022-03-24 | Disposition: A | Discharge status: Home / Self Care | Payer: Medicare Other | Source: Ambulatory Visit | Attending: Family Medicine | Admitting: Family Medicine

## 2022-03-24 DIAGNOSIS — Z1231 Encounter for screening mammogram for malignant neoplasm of breast: Secondary | ICD-10-CM

## 2022-03-24 DIAGNOSIS — M8580 Other specified disorders of bone density and structure, unspecified site: Secondary | ICD-10-CM

## 2022-09-28 ENCOUNTER — Other Ambulatory Visit: Payer: Self-pay | Admitting: Family Medicine

## 2022-09-28 DIAGNOSIS — Z1231 Encounter for screening mammogram for malignant neoplasm of breast: Secondary | ICD-10-CM

## 2023-03-28 ENCOUNTER — Ambulatory Visit
Admission: RE | Admit: 2023-03-28 | Discharge: 2023-03-28 | Disposition: A | Discharge status: Home / Self Care | Payer: Medicare Other | Source: Ambulatory Visit | Attending: Family Medicine | Admitting: Family Medicine

## 2023-03-28 DIAGNOSIS — Z1231 Encounter for screening mammogram for malignant neoplasm of breast: Secondary | ICD-10-CM

## 2023-04-10 IMAGING — MG MM DIGITAL SCREENING BILAT W/ TOMO AND CAD
8 series · 8 of 24 positions shown · non-contrast
Comparison: Previous exam(s).

CLINICAL DATA: Screening.

EXAM:
DIGITAL SCREENING BILATERAL MAMMOGRAM WITH TOMOSYNTHESIS AND CAD
TECHNIQUE: Bilateral screening digital craniocaudal and mediolateral oblique
mammograms were obtained. Bilateral screening digital breast
tomosynthesis was performed. The images were evaluated with
computer-aided detection.

[L CC synth-2D]
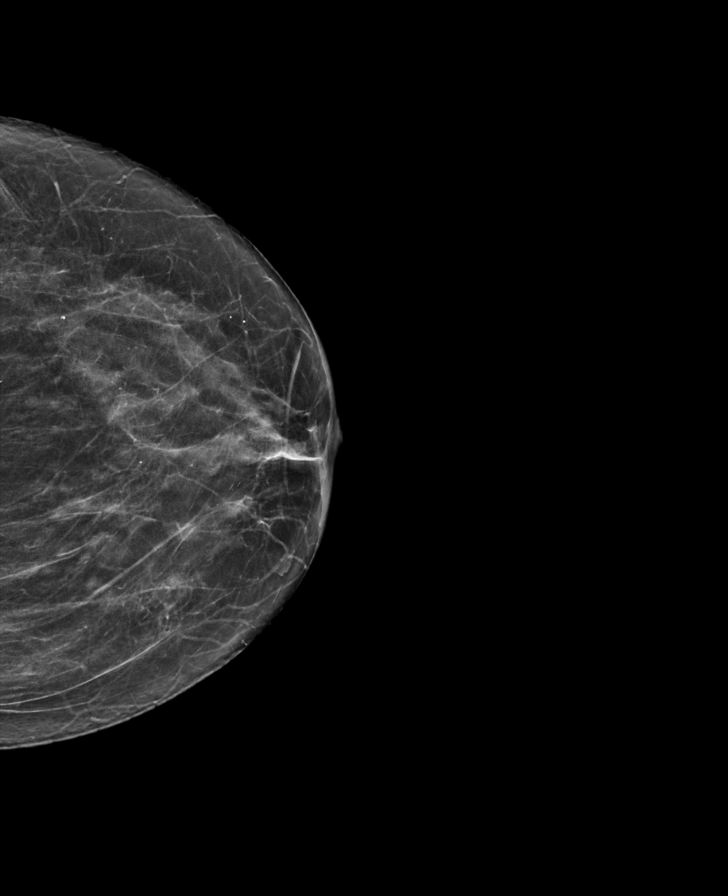

[R CC synth-2D]
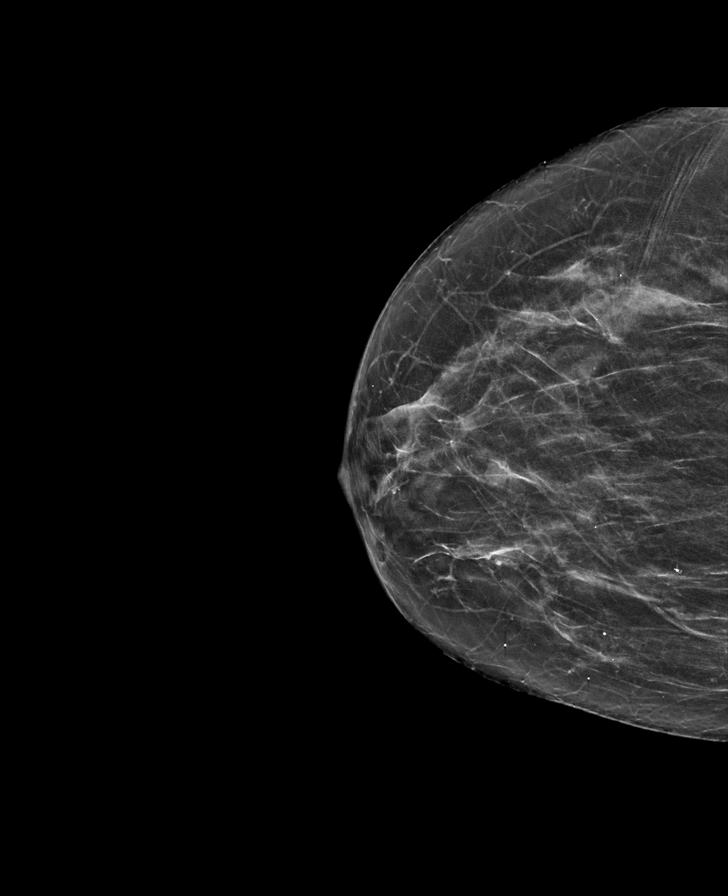

[R MLO synth-2D]
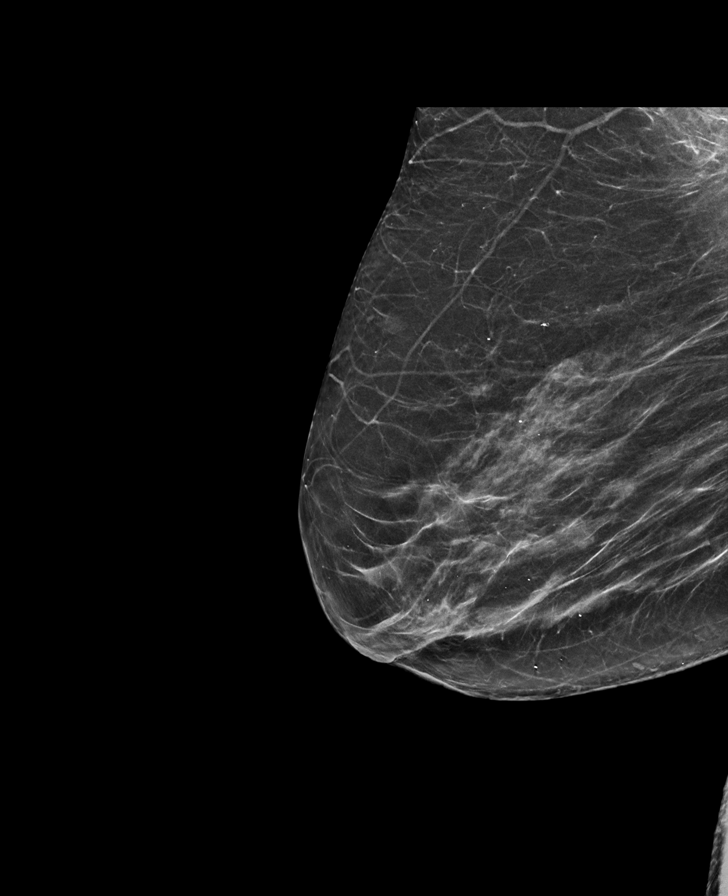

[L MLO synth-2D]
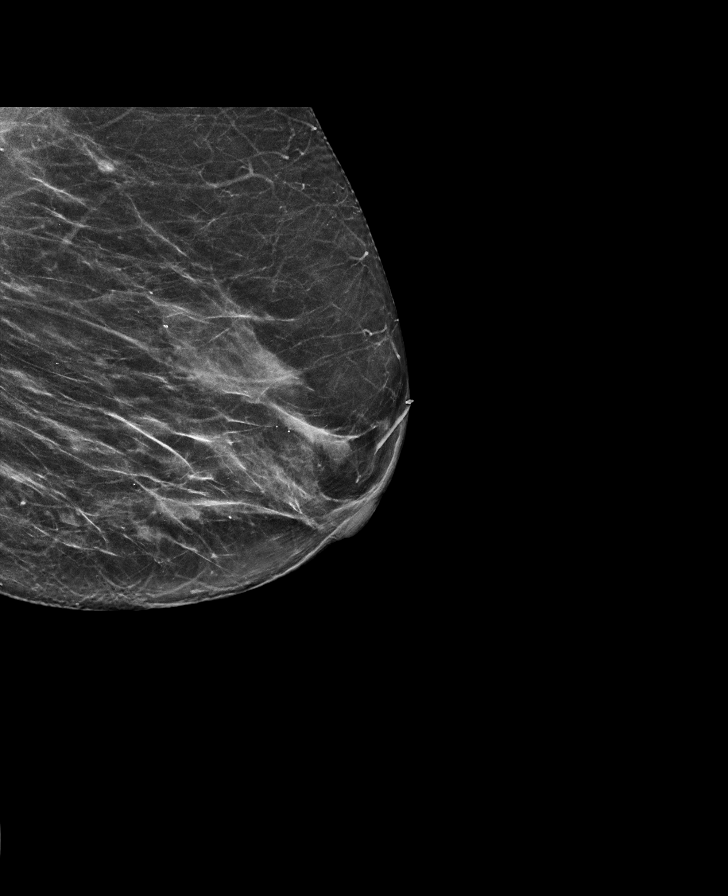

[L CC tomo · tomo slice 29/58.0]
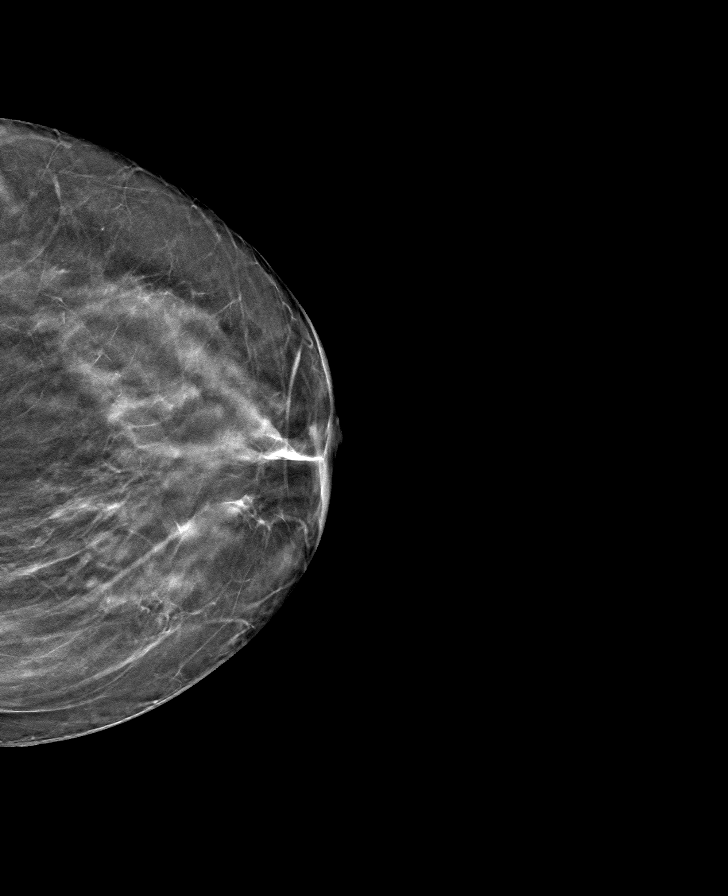

[L MLO tomo · tomo slice 33/64.0]
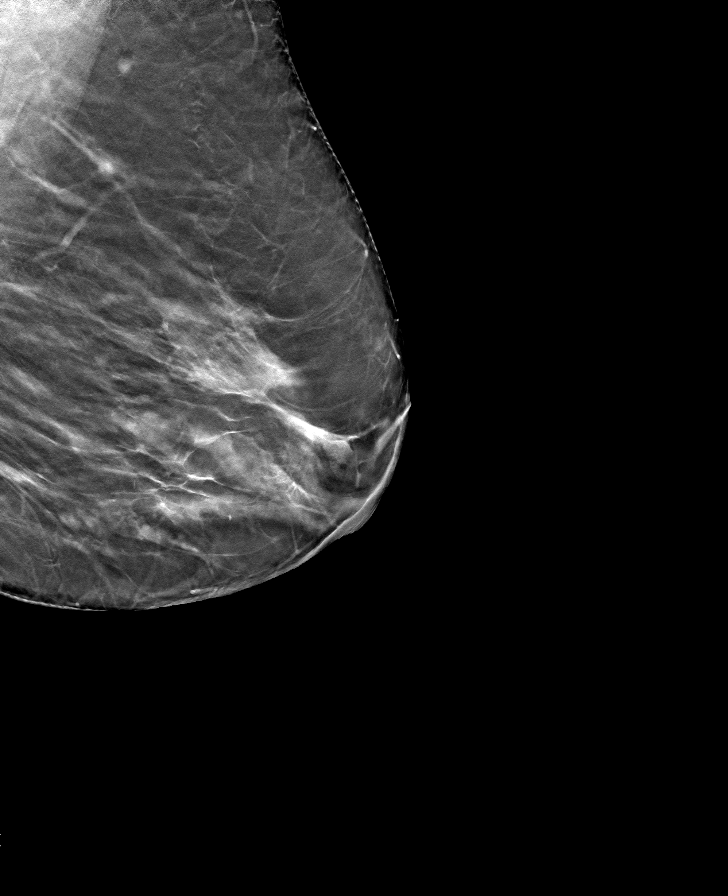

[R MLO tomo · tomo slice 31/61.0]
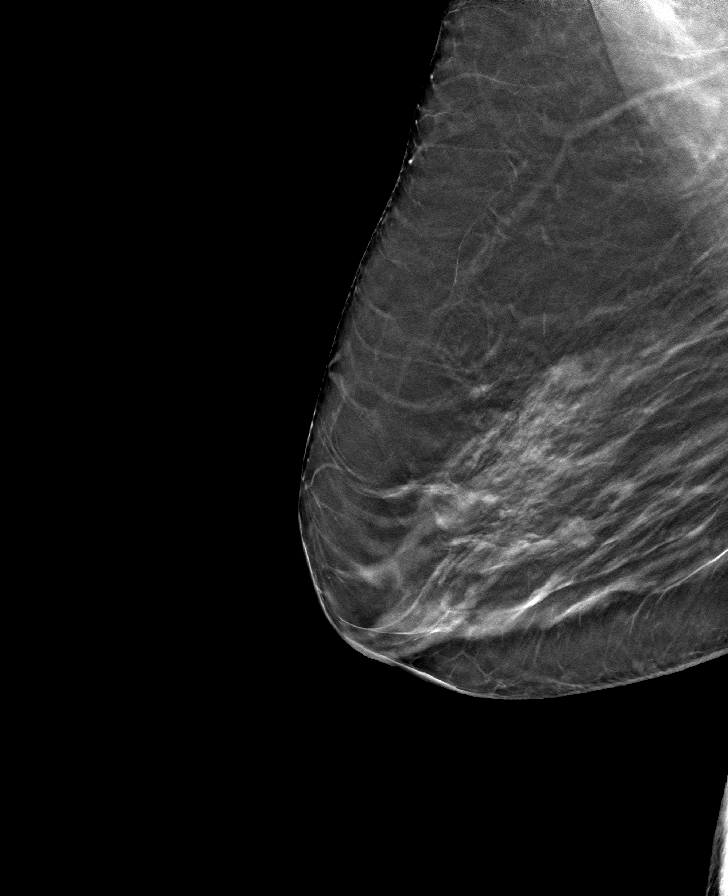

[R CC tomo · tomo slice 29/57.0]
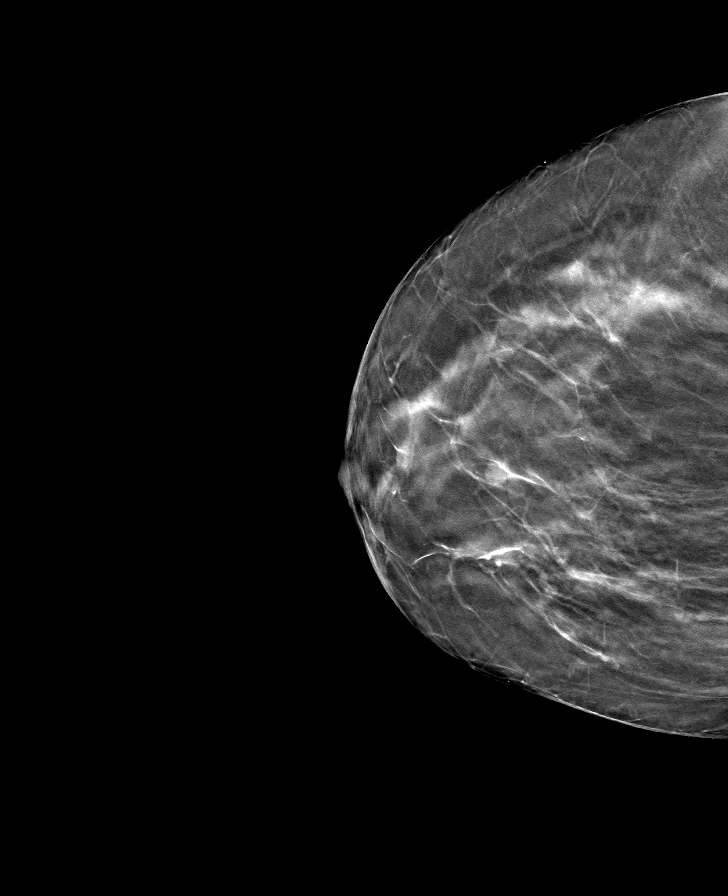

[8 of 24 positions shown; findings below may reference images not displayed]

ACR Breast Density Category b: There are scattered areas of
fibroglandular density.
FINDINGS: There are no findings suspicious for malignancy. The images were
evaluated with computer-aided detection.
IMPRESSION: No mammographic evidence of malignancy. A result letter of this
screening mammogram will be mailed directly to the patient.

RECOMMENDATION:
Screening mammogram in one year. (Code:WJ-[id number])

BI-RADS CATEGORY  1: Negative.

## 2023-10-12 ENCOUNTER — Other Ambulatory Visit: Payer: Self-pay | Admitting: Family Medicine

## 2023-10-12 DIAGNOSIS — Z1231 Encounter for screening mammogram for malignant neoplasm of breast: Secondary | ICD-10-CM

## 2023-10-12 DIAGNOSIS — E2839 Other primary ovarian failure: Secondary | ICD-10-CM

## 2024-06-01 ENCOUNTER — Encounter: Payer: Self-pay | Admitting: Family Medicine

## 2024-06-01 ENCOUNTER — Other Ambulatory Visit: Payer: Medicare Other

## 2024-06-01 ENCOUNTER — Ambulatory Visit
Admission: RE | Admit: 2024-06-01 | Discharge: 2024-06-01 | Disposition: A | Discharge status: Home / Self Care | Payer: Medicare Other | Source: Ambulatory Visit | Attending: Family Medicine | Admitting: Family Medicine

## 2024-06-01 DIAGNOSIS — Z1231 Encounter for screening mammogram for malignant neoplasm of breast: Secondary | ICD-10-CM

## 2024-06-20 ENCOUNTER — Ambulatory Visit (HOSPITAL_BASED_OUTPATIENT_CLINIC_OR_DEPARTMENT_OTHER)
Admission: RE | Admit: 2024-06-20 | Discharge: 2024-06-20 | Disposition: A | Discharge status: Home / Self Care | Source: Ambulatory Visit | Attending: Family Medicine | Admitting: Family Medicine

## 2024-06-20 DIAGNOSIS — E2839 Other primary ovarian failure: Secondary | ICD-10-CM | POA: Insufficient documentation
